# Patient Record
Sex: Female | Born: 1950
Health system: Southern US, Community
[De-identification: ages and names within clinical notes are randomized; demographics above are authoritative.]

## PROBLEM LIST (undated history)

## (undated) DIAGNOSIS — E7800 Pure hypercholesterolemia, unspecified: Secondary | ICD-10-CM

## (undated) DIAGNOSIS — D860 Sarcoidosis of lung: Secondary | ICD-10-CM

## (undated) DIAGNOSIS — E785 Hyperlipidemia, unspecified: Secondary | ICD-10-CM

## (undated) DIAGNOSIS — E119 Type 2 diabetes mellitus without complications: Secondary | ICD-10-CM

## (undated) HISTORY — DX: Type 2 diabetes mellitus without complications: E11.9

## (undated) HISTORY — DX: Sarcoidosis of lung: D86.0

## (undated) HISTORY — DX: Hyperlipidemia, unspecified: E78.5

---

## 1997-10-13 HISTORY — PX: LAPAROSCOPIC CHOLECYSTECTOMY: SUR755

## 2004-10-13 DIAGNOSIS — E119 Type 2 diabetes mellitus without complications: Secondary | ICD-10-CM

## 2004-10-13 HISTORY — DX: Type 2 diabetes mellitus without complications: E11.9

## 2007-11-29 ENCOUNTER — Ambulatory Visit: Payer: Self-pay | Admitting: Infectious Diseases

## 2007-11-29 ENCOUNTER — Ambulatory Visit: Payer: Self-pay | Admitting: Cardiology

## 2007-11-29 ENCOUNTER — Inpatient Hospital Stay (HOSPITAL_COMMUNITY): Admission: EM | Admit: 2007-11-29 | Discharge: 2007-12-04 | Payer: Self-pay | Admitting: Emergency Medicine

## 2007-11-29 ENCOUNTER — Ambulatory Visit: Payer: Self-pay | Admitting: Emergency Medicine

## 2007-12-02 ENCOUNTER — Encounter: Payer: Self-pay | Admitting: Emergency Medicine

## 2007-12-03 ENCOUNTER — Encounter: Payer: Self-pay | Admitting: Infectious Diseases

## 2007-12-10 ENCOUNTER — Ambulatory Visit: Payer: Self-pay | Admitting: Infectious Diseases

## 2007-12-10 ENCOUNTER — Ambulatory Visit (HOSPITAL_COMMUNITY): Admission: RE | Admit: 2007-12-10 | Discharge: 2007-12-10 | Payer: Self-pay | Admitting: Infectious Diseases

## 2007-12-10 DIAGNOSIS — B370 Candidal stomatitis: Secondary | ICD-10-CM

## 2007-12-10 DIAGNOSIS — J189 Pneumonia, unspecified organism: Secondary | ICD-10-CM | POA: Insufficient documentation

## 2007-12-10 LAB — CONVERTED CEMR LAB
ALT: 18 units/L (ref 0–35)
AST: 19 units/L (ref 0–37)
Alkaline Phosphatase: 38 units/L — ABNORMAL LOW (ref 39–117)
Basophils Absolute: 0.1 10*3/uL (ref 0.0–0.1)
Basophils Relative: 1 % (ref 0–1)
Bilirubin, Direct: <0.1 mg/dL (ref 0.0–0.3)
Eosinophils Absolute: 0.1 10*3/uL (ref 0.0–0.7)
Glucose, Bld: 179 mg/dL — ABNORMAL HIGH (ref 70–99)
Hemoglobin: 13.2 g/dL (ref 12.0–15.0)
Lymphocytes Relative: 12 % (ref 12–46)
Lymphs Abs: 0.8 10*3/uL (ref 0.7–4.0)
Neutrophils Relative %: 77 % (ref 43–77)
Platelets: 337 10*3/uL (ref 150–400)
RBC: 5.16 M/uL — ABNORMAL HIGH (ref 3.87–5.11)
Sodium: 137 meq/L (ref 135–145)
Total Protein: 7.2 g/dL (ref 6.0–8.3)

## 2007-12-14 DIAGNOSIS — E113299 Type 2 diabetes mellitus with mild nonproliferative diabetic retinopathy without macular edema, unspecified eye: Secondary | ICD-10-CM

## 2007-12-27 ENCOUNTER — Ambulatory Visit (HOSPITAL_COMMUNITY): Admission: RE | Admit: 2007-12-27 | Discharge: 2007-12-27 | Payer: Self-pay | Admitting: Infectious Diseases

## 2007-12-27 ENCOUNTER — Ambulatory Visit: Payer: Self-pay | Admitting: Infectious Diseases

## 2008-02-29 ENCOUNTER — Ambulatory Visit: Payer: Self-pay | Admitting: Infectious Diseases

## 2008-02-29 ENCOUNTER — Ambulatory Visit (HOSPITAL_COMMUNITY): Admission: RE | Admit: 2008-02-29 | Discharge: 2008-02-29 | Payer: Self-pay | Admitting: Infectious Diseases

## 2008-02-29 LAB — CONVERTED CEMR LAB
BUN: 17 mg/dL (ref 6–23)
Chloride: 99 meq/L (ref 96–112)
Potassium: 5 meq/L (ref 3.5–5.3)

## 2008-03-08 ENCOUNTER — Telehealth: Payer: Self-pay | Admitting: Infectious Diseases

## 2008-03-08 ENCOUNTER — Ambulatory Visit: Payer: Self-pay | Admitting: Infectious Diseases

## 2008-03-09 ENCOUNTER — Ambulatory Visit (HOSPITAL_COMMUNITY): Admission: RE | Admit: 2008-03-09 | Discharge: 2008-03-09 | Payer: Self-pay | Admitting: Infectious Diseases

## 2008-03-16 ENCOUNTER — Encounter (INDEPENDENT_AMBULATORY_CARE_PROVIDER_SITE_OTHER): Payer: Self-pay | Admitting: Licensed Clinical Social Worker

## 2008-04-21 ENCOUNTER — Ambulatory Visit: Payer: Self-pay | Admitting: Emergency Medicine

## 2008-04-21 DIAGNOSIS — R930 Abnormal findings on diagnostic imaging of skull and head, not elsewhere classified: Secondary | ICD-10-CM

## 2008-04-27 DIAGNOSIS — D860 Sarcoidosis of lung: Secondary | ICD-10-CM

## 2008-04-27 HISTORY — DX: Sarcoidosis of lung: D86.0

## 2008-04-28 ENCOUNTER — Ambulatory Visit: Admission: RE | Admit: 2008-04-28 | Discharge: 2008-04-28 | Payer: Self-pay | Admitting: Emergency Medicine

## 2008-04-28 ENCOUNTER — Ambulatory Visit: Payer: Self-pay | Admitting: Emergency Medicine

## 2008-04-28 ENCOUNTER — Encounter: Payer: Self-pay | Admitting: Emergency Medicine

## 2008-05-02 LAB — CONVERTED CEMR LAB: Angiotensin 1 Converting Enzyme: 73 units/L — ABNORMAL HIGH (ref 9–67)

## 2008-05-10 ENCOUNTER — Ambulatory Visit: Payer: Self-pay | Admitting: Emergency Medicine

## 2008-05-23 ENCOUNTER — Telehealth: Payer: Self-pay | Admitting: Emergency Medicine

## 2008-06-20 ENCOUNTER — Ambulatory Visit: Payer: Self-pay | Admitting: Emergency Medicine

## 2008-06-20 DIAGNOSIS — D869 Sarcoidosis, unspecified: Secondary | ICD-10-CM

## 2008-06-22 ENCOUNTER — Ambulatory Visit: Payer: Self-pay | Admitting: Internal Medicine

## 2008-06-22 ENCOUNTER — Encounter: Payer: Self-pay | Admitting: Internal Medicine

## 2008-06-22 LAB — CONVERTED CEMR LAB
AST: 21 units/L (ref 0–37)
Albumin: 4.7 g/dL (ref 3.5–5.2)
Alkaline Phosphatase: 49 units/L (ref 39–117)
BUN: 16 mg/dL (ref 6–23)
Blood Glucose, Fingerstick: 144
Creatinine, Ser: 0.61 mg/dL (ref 0.40–1.20)
Creatinine, Urine: 123.9 mg/dL
Hgb A1c MFr Bld: 6.1 %
Microalb, Ur: 0.7 mg/dL (ref 0.00–1.89)
Total Bilirubin: 0.8 mg/dL (ref 0.3–1.2)
Total CHOL/HDL Ratio: 5.3
VLDL: 34 mg/dL (ref 0–40)

## 2008-06-23 DIAGNOSIS — E785 Hyperlipidemia, unspecified: Secondary | ICD-10-CM | POA: Insufficient documentation

## 2008-07-20 ENCOUNTER — Ambulatory Visit: Payer: Self-pay | Admitting: Infectious Disease

## 2008-07-20 LAB — CONVERTED CEMR LAB: Blood Glucose, Fingerstick: 132

## 2008-09-01 ENCOUNTER — Ambulatory Visit: Payer: Self-pay | Admitting: Cardiology

## 2008-09-11 ENCOUNTER — Telehealth: Payer: Self-pay | Admitting: Infectious Diseases

## 2008-09-11 ENCOUNTER — Ambulatory Visit: Payer: Self-pay | Admitting: Emergency Medicine

## 2008-11-07 ENCOUNTER — Encounter: Payer: Self-pay | Admitting: Internal Medicine

## 2008-11-07 ENCOUNTER — Ambulatory Visit: Payer: Self-pay | Admitting: Infectious Disease

## 2008-11-07 LAB — CONVERTED CEMR LAB: Blood Glucose, Fingerstick: 134

## 2008-11-14 ENCOUNTER — Telehealth: Payer: Self-pay | Admitting: Internal Medicine

## 2009-01-31 ENCOUNTER — Telehealth: Payer: Self-pay | Admitting: *Deleted

## 2009-02-02 ENCOUNTER — Telehealth: Payer: Self-pay | Admitting: *Deleted

## 2009-05-07 ENCOUNTER — Telehealth: Payer: Self-pay | Admitting: Internal Medicine

## 2009-05-29 ENCOUNTER — Ambulatory Visit: Payer: Self-pay | Admitting: Internal Medicine

## 2009-05-29 LAB — CONVERTED CEMR LAB: Hgb A1c MFr Bld: 6.8 %

## 2009-05-30 LAB — CONVERTED CEMR LAB
AST: 24 units/L (ref 0–37)
BUN: 11 mg/dL (ref 6–23)
Cholesterol: 212 mg/dL — ABNORMAL HIGH (ref 0–200)
Creatinine, Ser: 0.69 mg/dL (ref 0.40–1.20)
Glucose, Bld: 141 mg/dL — ABNORMAL HIGH (ref 70–99)
LDL Cholesterol: 142 mg/dL — ABNORMAL HIGH (ref 0–99)
Microalb Creat Ratio: 6.4 mg/g (ref 0.0–30.0)
Potassium: 4.6 meq/L (ref 3.5–5.3)
Total CHOL/HDL Ratio: 5.6
Total Protein: 7.7 g/dL (ref 6.0–8.3)

## 2009-06-05 ENCOUNTER — Ambulatory Visit (HOSPITAL_COMMUNITY): Admission: RE | Admit: 2009-06-05 | Discharge: 2009-06-05 | Payer: Self-pay | Admitting: Internal Medicine

## 2009-06-05 ENCOUNTER — Encounter: Payer: Self-pay | Admitting: Gastroenterology

## 2009-06-05 DIAGNOSIS — J984 Other disorders of lung: Secondary | ICD-10-CM

## 2009-06-12 ENCOUNTER — Encounter: Payer: Self-pay | Admitting: Internal Medicine

## 2009-06-21 ENCOUNTER — Telehealth: Payer: Self-pay | Admitting: Internal Medicine

## 2009-06-26 ENCOUNTER — Ambulatory Visit (HOSPITAL_COMMUNITY): Admission: RE | Admit: 2009-06-26 | Discharge: 2009-06-26 | Payer: Self-pay | Admitting: Internal Medicine

## 2009-08-21 ENCOUNTER — Telehealth: Payer: Self-pay | Admitting: *Deleted

## 2009-08-27 ENCOUNTER — Telehealth: Payer: Self-pay | Admitting: *Deleted

## 2009-09-12 ENCOUNTER — Encounter: Payer: Self-pay | Admitting: Internal Medicine

## 2009-09-12 LAB — HM DIABETES EYE EXAM

## 2009-11-26 ENCOUNTER — Ambulatory Visit: Payer: Self-pay | Admitting: Internal Medicine

## 2009-11-26 LAB — CONVERTED CEMR LAB
Blood Glucose, AC Bkfst: 108 mg/dL
Hgb A1c MFr Bld: 7.2 %

## 2009-12-06 ENCOUNTER — Telehealth: Payer: Self-pay | Admitting: Internal Medicine

## 2009-12-20 ENCOUNTER — Ambulatory Visit (HOSPITAL_COMMUNITY): Admission: RE | Admit: 2009-12-20 | Discharge: 2009-12-20 | Payer: Self-pay | Admitting: Internal Medicine

## 2010-03-29 IMAGING — PT NM PET TUM IMG INITIAL (PI) SKULL BASE T - THIGH
1 of 8 series · 1 of 25 positions shown · non-contrast
Comparison: 06/05/2009 chest CT

CLINICAL DATA: Recent CT imaging demonstrates a new spiculated
nodule in the right upper lobe.  PET CT requested to evaluate for
possible malignancy. Patient with sarcoid and lung involvement and
history of smoking.

NUCLEAR MEDICINE PET CT SKULL BASE TO THIGH
TECHNIQUE: 15.6 mCi F-18 FDG was injected intravenously via the
left antecubital vein.  Full-ring PET imaging was performed from
the skull base through the mid-thighs 54  minutes after injection.
CT data was obtained and used for attenuation correction and
anatomic localization only.  (This was not acquired as a diagnostic
CT examination.)
Fasting Blood Glucose:  141

[Series 2: ct images · axial · 3.8mm · 0.98mm/px · 1 of 255 slices shown]
[im 255/255  brain]
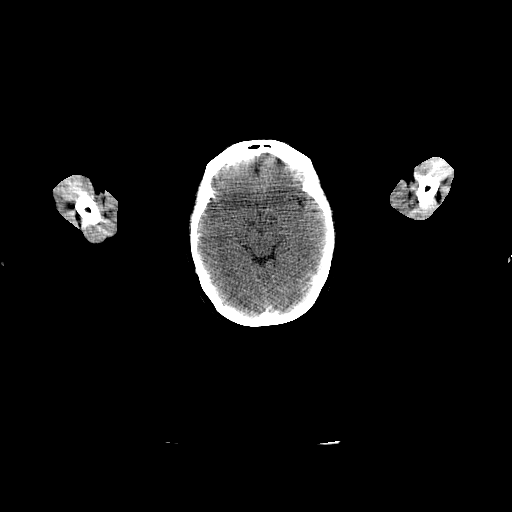

[1 of 25 positions shown; findings below may reference images not displayed]

FINDINGS: No abnormal areas of increased metabolic activity are
identified.

The 1.6 cm right upper lobe spiculated opacity (image 68) has a
maximum S U V of 1.9.

Review of the CT images demonstrates mild cardiomegaly, mildly
enlarged non-hypermetabolic bilateral hilar and mediastinal lymph
nodes, chronic interstitial opacities and bibasilar scarring, fatty
infiltration of the liver, cholecystectomy, and mild colonic
diverticulosis.
No acute or suspicious bony abnormalities are identified.
IMPRESSION: 1.6 cm right upper lobe spiculated opacity does not demonstrate
increased F D G activity and is likely benign, probably related to
this patient's sarcoid.  However as low grade or non-hypermetabolic
neoplasm is not entirely excluded, recommend CT follow-up in 6-12
months to insure stability / resolution.

Stable pulmonary changes, mild hilar mediastinal lymphadenopathy,
fatty infiltration of the liver, and mild cardiomegaly when
compared to the prior study.

## 2010-07-30 ENCOUNTER — Telehealth: Payer: Self-pay | Admitting: Internal Medicine

## 2010-08-28 ENCOUNTER — Telehealth (INDEPENDENT_AMBULATORY_CARE_PROVIDER_SITE_OTHER): Payer: Self-pay | Admitting: *Deleted

## 2010-10-01 ENCOUNTER — Telehealth: Payer: Self-pay | Admitting: *Deleted

## 2010-11-03 ENCOUNTER — Encounter: Payer: Self-pay | Admitting: Internal Medicine

## 2010-11-04 ENCOUNTER — Encounter: Payer: Self-pay | Admitting: Internal Medicine

## 2010-11-12 NOTE — Progress Notes (Signed)
Summary: PREVENTIVE COLONOSCOPY  Phone Note Outgoing Call   Summary of Call: Checked EMR and [facility].  No information found in regards to patient having a Colonoscopy.  Patient was sch on 06/05/2009 for a Nurse visit with Dr. Marvell Fuller office and patient was a no show.  Patient's procedure was sch for Sept 9, 2011.  A no show letter was sent to patient.  Checked patient's insurance.  She was given a [facility] Card through [employer] which expired in 04/11 of this year.  Patient will have to update her information with [facility]. Initial call taken by: Shon Hough,  August 28, 2010 1:33 PM

## 2010-11-12 NOTE — Assessment & Plan Note (Signed)
Summary: EST-CK/FU/MEDS/CFB   Vital Signs:  Patient profile:   60 year old female Height:      61 inches Weight:      187.6 pounds BMI:     35.57 Temp:     97.4 degrees F oral Pulse rate:   80 / minute BP sitting:   108 / 74  (right arm)  Vitals Entered By: Filomena Jungling NT II (November 26, 2009 4:23 PM) CC: checkup Is Patient Diabetic? Yes Pain Assessment Patient in pain? no      Nutritional Status BMI of > 30 = obese  Does patient need assistance? Functional Status Self care Ambulation Normal   Primary Care Provider:  Clydie Braun MD  CC:  checkup.  History of Present Illness: [name] is a 60 yo woman with PMH as outlined below.  She is here for routine follow up and without complaints.  She last had a PET-CT scan for new lung nodule noted on CT for sarcoidosis surveillence.  PET-CT reassuring in 06/2009.  She continues to do well without any new respiratory complaints.  She has stopped exercising but not due to symptoms, mainly b/c she was taking care of her grandaughter and cold weather.  She has been out of her lovastatin for some time now and reports she has just run out of her metformin.   All other systems reviewed and negative.  Preventive Screening-Counseling & Management  Alcohol-Tobacco     Smoking Status: quit     Year Quit: 10 years ago  Caffeine-Diet-Exercise     Does Patient Exercise: no  Medications Prior to Update: 1)  Metformin Hcl 1000 Mg  Tabs (Metformin Hcl) .... Take 1 Tablet By Mouth Two Times A Day 2)  Lovastatin 10 Mg Tabs (Lovastatin) .... 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  Lives with husband and son in an appartment.  has lived there for 3 yrs, in good repair, no water damage.  No sick contacts.  Does occas have contact with grandchildren ages 64 and 92.  No currnet pet or animal exposure, but she owned a bird (?canary) while in [place] - not for over 5 yrs.  No recent travel.  Immigrated from [place] 5 yrs ago.  No tob, etoh drugs. No known exposure to TB .   Risk Factors: Exercise: no (11/26/2009)  Risk Factors: Smoking Status: quit (11/26/2009)  Past Medical History: Diabetes mellitus, type II:  Dx'd about 2006. Sarcoidosis:  brushing during FOB by Dr. Delton Coombes on 04/27/08:  non-caseating granulomas, giant cells with                       refractile material      CT 09/01/08:           -Bilateral patchy airspace disease.  Mild traction bronchiectasis lower lobes.  Bihilar adenopathy.                            Mediastinal adenopathy, approximately the same in degree as noted on 03/09/2008 CT.        CT 06/05/09:           -Stable mild mediastinal and hilar adenopathy and chronic nodular  interstitial lung disease, consistent with              sarcoidosis.        -New 16 mm spiculated nodule in the right upper lobe.  While this may be related to sarcoidosis,                              bronchogenic carcinoma cannot be excluded.  PET CT is unlikely to be beneficial in this setting, and                       percutaneous needle biopsy or short-term CT followup in 2-3 months should be considered.      PET 06/26/09:            -1.6 cm right upper lobe spiculated opacity does not demonstrate increased F D G activity and is likely                     benign, probably related to this patient's sarcoid.  However as low grade or non-hypermetabolic           neoplasm is not entirely excluded, recommend CT follow-up in 6-12 months to insure stability / resolution.           -Stable pulmonary changes, mild hilar mediastinal lymphadenopathy, fatty infiltration of the liver, and mild                cardiomegaly when compared to the  prior study.           Prior consideration:         -Bilateral PNA of unclear etiology Feb 2009 -seen by ID and pulm wihtout obvious source - ? ILD               -extensive infectious w/u including BAL and prelim autoimmune w/u neg, including ACE level, see                           comments on bilateral PNA (problem list).   Pneumococcal vaccine 2007.  Review of Systems      See HPI  Physical Exam  General:  alert, normal appearance, and overweight-appearing.   Eyes:  anicteric Neck:  no masses, thyromegaly, or abnormal cervical nodes Lungs:  normal respiratory effort, no accessory muscle use, normal breath sounds, no crackles, and no wheezes.   Heart:  normal rate, regular rhythm, no murmur, no gallop, no rub, and no JVD.   Abdomen:  soft, non-tender, normal bowel sounds, and no distention.   Extremities:  no edema Neurologic:  alert & oriented X3, cranial nerves II-XII intact, strength normal in all extremities, sensation intact to light touch, and gait normal.   Cervical Nodes:  no anterior cervical adenopathy and no posterior cervical adenopathy.   Psych:  Oriented X3, memory intact for recent and remote, normally interactive, good eye contact, not anxious appearing, and not depressed appearing.     Impression & Recommendations:  Problem # 1:  LUNG NODULE (ICD-518.89) Has 6 month f/u CT 12/20/09  Problem # 2:  SARCOIDOSIS (ICD-135) Symptomatically doing well. Has 6 month f/u CT on 12/20/09 as above Will schedule with Dr. Delton Coombes after CT scan  Problem # 3:  DIABETES MELLITUS, TYPE II (ICD-250.00) A1c slightly worse Discussed diet and exercise She has been drinking some regular sodas, juices and sweets.... discussed issues with excess sugar  and supplement with complex carbs If not improved in next 3 months, will start glipizide  Her updated medication list for this problem includes:    Metformin Hcl 1000 Mg Tabs (Metformin hcl) .Marland Kitchen... Take 1 tablet by mouth two times a  day  Orders: T- Capillary Blood Glucose (16109) T-Hgb A1C (in-house) (60454UJ)  Labs Reviewed: Creat: 0.69 (05/29/2009)     Last Eye Exam: No diabetic retinopathy.    (09/12/2009) Reviewed HgBA1c results: 7.2 (11/26/2009)  6.8 (05/29/2009)  Problem # 4:  HYPERLIPIDEMIA (ICD-272.4) Has not taken lovastatin in last few months. Initially took pravachol but had some myalgias Will give trial of simvastatin ($5 at [facility]) as $4 lovastatin unlikely to reduce LDL sufficiently  Her updated medication list for this problem includes:    Simvastatin 20 Mg Tabs (Simvastatin) .Marland Kitchen... Take 1 tablet by mouth once a day  Labs Reviewed: SGOT: 24 (05/29/2009)   SGPT: 35 (05/29/2009)   HDL:38 (05/29/2009), 48 (06/22/2008)  LDL:142 (05/29/2009), 172 (06/22/2008)  Chol:212 (05/29/2009), 254 (06/22/2008)  Trig:158 (05/29/2009), 168 (06/22/2008)  Problem # 5:  Preventive Health Care (ICD-V70.0) flu tetanus f/u mammo and gi referrals   Orders: T- Capillary Blood Glucose (81191) T-Hgb A1C (in-house) (47829FA)  Complete Medication List: 1)  Metformin Hcl 1000 Mg Tabs (Metformin hcl) .... Take 1 tablet by mouth two times a day 2)  Simvastatin 20 Mg Tabs (Simvastatin) .... Take 1 tablet by mouth once a day  Patient Instructions: 1)  Please schedule a follow-up appointment in 1 month (after 12/20/09) 2)  Cambiamos el medicamento para el colesterol.  Lo puedes conseguir en la farmacia del departamento de salud ([facility]   3)  Aquerdese de eliminar los carboidratos. 4)  Le vamos a sacar una cita con Dr. Delton Coombes para que lo veas despues de el CT scan. Prescriptions: METFORMIN HCL 1000 MG  TABS (METFORMIN HCL) Take 1 tablet by mouth two times a day  #60 x 6   Entered and Authorized by:   Mariea Stable MD   Signed by:   Mariea Stable MD on 11/26/2009   Method used:   Print then Give to Patient   RxID:   [id number] SIMVASTATIN 20 MG TABS (SIMVASTATIN) Take 1 tablet by  mouth once a day  #30 x 6   Entered and Authorized by:   Mariea Stable MD   Signed by:   Mariea Stable MD on 11/26/2009   Method used:   Print then Give to Patient   RxID:   [id number]     Laboratory Results   Blood Tests   Date/Time Received: November 26, 2009 4:28 PM Date/Time Reported: Alric Quan  November 26, 2009 4:28 PM  HGBA1C: 7.2%   (Normal Range: Non-Diabetic - 3-6%   Control Diabetic - 6-8%) CBG Fasting:: 108mg /dL      Prevention & Chronic Care Immunizations   Influenza vaccine: Fluvax 3+  (07/20/2008)    Tetanus booster: Not documented    Pneumococcal vaccine: Historical  (10/13/2005)  Colorectal Screening   Hemoccult: Not documented    Colonoscopy: Not documented   Colonoscopy action/deferral: GI referral  (05/29/2009)  Other Screening   Pap smear: Not documented   Pap smear action/deferral: Deferred  (11/26/2009)    Mammogram: Not documented   Mammogram action/deferral: Ordered  (05/29/2009)   Smoking status: quit  (11/26/2009)  Diabetes Mellitus   HgbA1C: 7.2  (11/26/2009)    Eye exam: No diabetic retinopathy.     (09/12/2009)    Foot exam: yes  (  11/07/2008)   High risk foot: No  (11/07/2008)   Foot care education: Not documented    Urine microalbumin/creatinine ratio: 6.4  (05/29/2009)    Diabetes flowsheet reviewed?: Yes   Progress toward A1C goal: Unchanged  Lipids   Total Cholesterol: 212  (05/29/2009)   LDL: 142  (05/29/2009)   LDL Direct: Not documented   HDL: 38  (05/29/2009)   Triglycerides: 158  (05/29/2009)    SGOT (AST): 24  (05/29/2009)   SGPT (ALT): 35  (05/29/2009)   Alkaline phosphatase: 59  (05/29/2009)   Total bilirubin: 0.6  (05/29/2009)    Lipid flowsheet reviewed?: Yes   Progress toward LDL goal: Unchanged  Self-Management Support :    Diabetes self-management support: Written self-care plan  (11/26/2009)   Diabetes care plan printed    Lipid self-management support: Written self-care  plan  (11/26/2009)   Lipid self-care plan printed.   Nursing Instructions: Give Flu vaccine today Give tetanus booster today

## 2010-11-12 NOTE — Progress Notes (Signed)
  Phone Note Outgoing Call   Call placed by: Mariea Stable MD,  December 06, 2009 11:56 AM Call placed to: Patient Summary of Call: pt called and notified of CT and Dr. Kavin Leech appointments. Initial call taken by: Mariea Stable MD,  December 06, 2009 11:56 AM

## 2010-11-12 NOTE — Progress Notes (Signed)
Summary: med refill/gp  Phone Note Refill Request Message from:  Patient's son on July 30, 2010 11:58 AM  Refills Requested: Medication #1:  METFORMIN HCL 1000 MG  TABS Take 1 tablet by mouth two times a day Last  appt.  11/26/09; next appt. 09/18/10.   Method Requested: Electronic Initial call taken by: Chinita Pester RN,  July 30, 2010 11:58 AM  Follow-up for Phone Call        Rx faxed to pharmacy Follow-up by: Mariea Stable MD,  July 30, 2010 12:13 PM    Prescriptions: METFORMIN HCL 1000 MG  TABS (METFORMIN HCL) Take 1 tablet by mouth two times a day  #60 x 6   Entered and Authorized by:   Mariea Stable MD   Signed by:   Mariea Stable MD on 07/30/2010   Method used:   Electronically to        [facility] [address] (retail)       [phone]       [place], [place]  [address]       Ph: [phone]       Fax: [phone]   RxID:   [id number]

## 2010-11-13 ENCOUNTER — Ambulatory Visit (INDEPENDENT_AMBULATORY_CARE_PROVIDER_SITE_OTHER): Payer: Self-pay | Admitting: Internal Medicine

## 2010-11-13 ENCOUNTER — Other Ambulatory Visit: Payer: Self-pay | Admitting: Internal Medicine

## 2010-11-13 ENCOUNTER — Encounter: Payer: Self-pay | Admitting: Internal Medicine

## 2010-11-13 VITALS — BP 132/83 | HR 86 | Temp 97.3°F | Ht 61.0 in | Wt 185.6 lb

## 2010-11-13 DIAGNOSIS — Z1211 Encounter for screening for malignant neoplasm of colon: Secondary | ICD-10-CM

## 2010-11-13 DIAGNOSIS — D869 Sarcoidosis, unspecified: Secondary | ICD-10-CM

## 2010-11-13 DIAGNOSIS — Z1231 Encounter for screening mammogram for malignant neoplasm of breast: Secondary | ICD-10-CM

## 2010-11-13 DIAGNOSIS — Z1239 Encounter for other screening for malignant neoplasm of breast: Secondary | ICD-10-CM

## 2010-11-13 DIAGNOSIS — E785 Hyperlipidemia, unspecified: Secondary | ICD-10-CM

## 2010-11-13 DIAGNOSIS — E119 Type 2 diabetes mellitus without complications: Secondary | ICD-10-CM

## 2010-11-13 LAB — GLUCOSE, CAPILLARY: Glucose-Capillary: 175 mg/dL — ABNORMAL HIGH (ref 70–99)

## 2010-11-13 LAB — POCT GLYCOSYLATED HEMOGLOBIN (HGB A1C): Hemoglobin A1C: 7.9

## 2010-11-13 MED ORDER — PRAVASTATIN SODIUM 40 MG PO TABS: 40.0000 mg | ORAL_TABLET | Freq: Every evening | ORAL | Status: DC

## 2010-11-13 MED ORDER — GLIPIZIDE 5 MG PO TABS: 5.0000 mg | ORAL_TABLET | Freq: Every day | ORAL | Status: DC

## 2010-11-13 NOTE — Assessment & Plan Note (Signed)
Stable per imaging.  No symptoms to warrant treatment.

## 2010-11-13 NOTE — Assessment & Plan Note (Signed)
A1c up to 7.9 from 7.2. Start glipizide with largest meal. Recheck in 3 months.

## 2010-11-13 NOTE — Progress Notes (Signed)
  Subjective:    Patient ID: [name], female    DOB: [birth year], 60 y.o.   MRN: [id number]  HPI Here for routine follow up.  She has no complaints today.  Reports a recent viral URI with associated cough now resolved.  Of note, she was referred to GI last visit for colonoscopy but was afraid and did not go to appointment.     Review of Systems  Constitutional: Negative for fever, activity change, appetite change and fatigue.  Respiratory: Negative for cough, chest tightness, shortness of breath and wheezing.   Cardiovascular: Negative for chest pain and palpitations.  Genitourinary: Negative for vaginal bleeding, vaginal discharge and pelvic pain.  Musculoskeletal: Positive for back pain.  Neurological: Negative for weakness and numbness.       Objective:   Physical Exam  Constitutional: She is oriented to person, place, and time. She appears well-developed and well-nourished. No distress.  HENT:  Head: Normocephalic and atraumatic.  Eyes: EOM are normal. Pupils are equal, round, and reactive to light. No scleral icterus.  Neck: Normal range of motion. Neck supple.  Pulmonary/Chest: Effort normal.  Neurological: She is alert and oriented to person, place, and time. Coordination normal.  Skin: She is not diaphoretic.  Psychiatric: She has a normal mood and affect. Her behavior is normal. Thought content normal.          Assessment & Plan:

## 2010-11-13 NOTE — Assessment & Plan Note (Signed)
Not taking simvastatin because [facility] pharmacy is too far. Will start pravastatin since it is on $4 list at [facility]. Check lipids and LFTs in 3 months, will defer on checking now as assume they will be elevated as they were last time.

## 2010-11-13 NOTE — Assessment & Plan Note (Signed)
A1c up to 7.9 from 7.2 Will start glipizide 5mg  po daily and f/u 3 months.

## 2010-11-14 NOTE — Progress Notes (Signed)
Summary: med refill/gp  Phone Note Refill Request Message from:  Fax from Pharmacy on October 01, 2010 2:22 PM  Refills Requested: Medication #1:  SIMVASTATIN 20 MG TABS Take 1 tablet by mouth once a day. Last appt. Feb 2011.   Method Requested: Electronic Initial call taken by: Chinita Pester RN,  October 01, 2010 2:22 PM  Follow-up for Phone Call        Refilled electronically. Please make an appointment and notify patient. Follow-up by: Margarito Liner MD,  October 03, 2010 9:14 AM  Additional Follow-up for Phone Call Additional follow up Details #1::        Flag sent to [facility] for an appt. Waiting on Dr.Alvarez's Feb.schedule. Additional Follow-up by: Chinita Pester RN,  October 03, 2010 3:31 PM    Prescriptions: SIMVASTATIN 20 MG TABS (SIMVASTATIN) Take 1 tablet by mouth once a day  #30 x 0   Entered and Authorized by:   Margarito Liner MD   Signed by:   Margarito Liner MD on 10/03/2010   Method used:   Electronically to        [facility] [address] (retail)       [phone]       [place], [place]  [address]       Ph: [phone]       Fax: [phone]   RxID:   [id number]

## 2010-11-26 ENCOUNTER — Ambulatory Visit (HOSPITAL_COMMUNITY): Payer: Self-pay

## 2010-11-27 ENCOUNTER — Ambulatory Visit (HOSPITAL_COMMUNITY)
Admission: RE | Admit: 2010-11-27 | Discharge: 2010-11-27 | Disposition: A | Discharge status: Home / Self Care | Payer: Self-pay | Source: Ambulatory Visit | Attending: Internal Medicine | Admitting: Internal Medicine

## 2010-11-27 DIAGNOSIS — Z1231 Encounter for screening mammogram for malignant neoplasm of breast: Secondary | ICD-10-CM | POA: Insufficient documentation

## 2010-12-03 ENCOUNTER — Telehealth: Payer: Self-pay | Admitting: *Deleted

## 2010-12-03 NOTE — Telephone Encounter (Signed)
Call to pt to inform her of her appointment with [facility].  Nurse visit scheduled at no charge.  Appointment is 01/30/2011 at 4:30 PM.  Cost of the Colonoscopy runs from $1500.00 up .  Message left to have pt's son call the Clinics.

## 2011-01-01 LAB — GLUCOSE, CAPILLARY: Glucose-Capillary: 108 mg/dL — ABNORMAL HIGH (ref 70–99)

## 2011-01-17 LAB — GLUCOSE, CAPILLARY: Glucose-Capillary: 141 mg/dL — ABNORMAL HIGH (ref 70–99)

## 2011-01-18 LAB — GLUCOSE, CAPILLARY: Glucose-Capillary: 121 mg/dL — ABNORMAL HIGH (ref 70–99)

## 2011-02-25 ENCOUNTER — Other Ambulatory Visit: Payer: Self-pay | Admitting: *Deleted

## 2011-02-25 MED ORDER — METFORMIN HCL 1000 MG PO TABS: 1000.0000 mg | ORAL_TABLET | Freq: Two times a day (BID) | ORAL | Status: DC

## 2011-02-25 NOTE — Op Note (Signed)
[name], [name]              ACCOUNT NO.:  [id number]   MEDICAL RECORD NO.:  [id number]          PATIENT TYPE:  AMB   LOCATION:  CARD                         FACILITY:  [facility]   PHYSICIAN:  Leslye Peer, MD    DATE OF BIRTH:  06/17/1951   DATE OF PROCEDURE:  04/28/2008  DATE OF DISCHARGE:                               OPERATIVE REPORT   PROCEDURES:  Fiberoptic bronchoscopy with bronchoalveolar lavage and  transbronchial biopsies.   OPERATOR:  Leslye Peer, MD   INDICATION:  Bilateral infiltrates.   CONSENT:  Informed consent was obtained from the patient using a Manufacturing engineer and a signed copy is on her hospital chart.   MEDICATIONS GIVEN:  1. Fentanyl 125 mcg IV in divided doses.  2. Versed 6 mg IV in divided doses.  3. Lidocaine 1% for a total of 12 mL to the bronchoalveolar tree.   PROCEDURE IN DETAIL:  After informed consent was obtained as outlined  above conscious sedation was initiated.  The fiberoptic bronchoscope was  introduced through the right naris without difficulty.  The cords were  normal in appearance and moved normally with phonation and with deep  inspiration.  The trachea was intubated and local anesthesia was  achieved with 1% lidocaine to the bronchoalveolar tree.  The entire exam  showed normal anatomy with a sharp main carina, clear bilateral mainstem  bronchi.  The left side of the exam showed no abnormalities in the left  upper lingular and left lower lobe airways.  In particular there were no  abnormal secretions and no endobronchial lesions were seen.  The right-  sided anatomy was likewise normal.  The right upper lobe, right middle  lobe and right lower lobe bronchi were all clear without any secretions  or endobronchial lesions.  Given the abnormalities noted on her CT scan  of the chest, bronchoalveolar lavage was performed from the right upper  lobe to be sent for microbiology, cytology and also CD4 and CD8 counts.  Transbronchial biopsies were then performed from several subsegments of  the right lower lobe.  Four biopsies total were performed and these were  sent for pathology.  The patient tolerated the procedure well.  The  biopsies were performed under fluoroscopy and there was no evidence of  pneumothorax on fluoroscopy at the end of the procedure.  A chest x-ray  was pending.   PLANS:  We will follow up the pathology and microbiology with Ms.  [name] when it becomes available.      Leslye Peer, MD  Electronically Signed    RSB/MEDQ  D:  04/28/2008  T:  04/28/2008  Job:  [id number]   cc:   Mick Sell, MD

## 2011-02-25 NOTE — Telephone Encounter (Signed)
Pt is out of meds Call Onalee Hua at [phone] when ready

## 2011-02-25 NOTE — Op Note (Signed)
[name], [name]              ACCOUNT NO.:  [id number]   MEDICAL RECORD NO.:  [id number]          PATIENT TYPE:  INP   LOCATION:  5509                         FACILITY:  [facility]   PHYSICIAN:  Leslye Peer, MD    DATE OF BIRTH:  1951-09-12   DATE OF PROCEDURE:  12/02/2007  DATE OF DISCHARGE:                               OPERATIVE REPORT   PROCEDURE:  Fiberoptic bronchoscopy with bronchoalveolar lavage and  brushings.   OPERATOR:  Leslye Peer, M.D.   INDICATION:  Bilateral infiltrates, rule out tuberculosis.   MEDICATIONS GIVEN:  1. Included fentanyl 100 mcg IV in divided doses and Versed 4 mg IV in      divided doses.  2. Lidocaine 1%, 15-20 mL to the bronchoalveolar tree.   CONSENT:  Informed consent was obtained from the patient.  A signed copy  is on her hospital chart.   PROCEDURE DETAILS:  After informed consent was obtained as outlined  above, conscious sedation was initiated as outlined above, the  fiberoptic bronchoscope was introduced in the right naris without  difficulty.  The posterior pharynx was anesthetized with 1% lidocaine.  The cords were normal in appearance and moved normally.  The trachea was  intubated and appeared normal, as well.  Local anesthesia was again  performed with 1% lidocaine for approximately 15-20 mL total to the  bronchoalveolar tree. All airways were inspected including the bilateral  main stem bronchi, the right upper lobe, middle lobe and lower lobe  bronchi, and the left upper lobe, lingula, and lower lobe bronchi.  There were no gross anatomical abnormality, there were no endobronchial  lesions, and there were no significant secretions.  Bronchoalveolar  lavage was performed from two subsegments of the right lower lobe with a  total of 60 mL instilled and 28 mL returned.  These were pooled and will  be sent for microbiology and cytology. Brushings were performed from the  posterior segment of the right lower lobe and were  placed on slide for  AFB stain.  The patient tolerated procedure well.  There was no blood  loss and no obvious complication.  She was hemodynamically stable  throughout. A chest x-ray is pending at the end of the procedure.   SAMPLES:  1. Bronchoalveolar lavage from the right lower lobe to be sent for      microbiology.  2. Bronchial brushings from the right lower lobe to be sent for AFB      stain.   PLAN:  I will follow up the culture results when they become available.      Leslye Peer, MD  Electronically Signed     RSB/MEDQ  D:  12/02/2007  T:  12/03/2007  Job:  [id number]

## 2011-02-25 NOTE — Discharge Summary (Signed)
[name], [name]              ACCOUNT NO.:  [id number]   MEDICAL RECORD NO.:  [id number]          PATIENT TYPE:  INP   LOCATION:  5509                         FACILITY:  [facility]   PHYSICIAN:  Mick Sell, MD DATE OF BIRTH:  1951-08-21   DATE OF ADMISSION:  11/29/2007  DATE OF DISCHARGE:  12/05/2007                               DISCHARGE SUMMARY   PRIMARY CARE PHYSICIAN:  [facility].   CONTINUITY PHYSICIAN:  Mick Sell, M.D., [facility], Infectious Disease Division.   DISCHARGE DIAGNOSES:  1. 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  2. Type 2 diabetes mellitus.  3. Back pain of unclear etiology.  4. History of immigration from [place].   DISCHARGE MEDICATIONS:  1. Metformin 1000 mg by mouth twice daily.  2. Lisinopril 5 mg by mouth daily.  3. Isoniazid 300 mg by mouth daily for 6 months.  4. Rifampin 600 mg by mouth daily for 6 months  5. Ethambutol 1200 mg by mouth daily for 6 months.  6. Pyrazinamide 1500 mg by mouth daily for 6 months.  7. Vitamin B6 (pyridoxine) 50 mg by mouth daily for 6 months.   CONSULTATIONS:  Pulmonary critical care medicine was consulted for a  bronchoalveolar lavage.   PROCEDURES:  1. An x-ray with 2 views of the chest on November 29, 2007      demonstrated extensive bilateral pulmonary infiltrates.  2. A computed tomography scan of the chest on November 30, 2007      demonstrated diffuse bilateral infiltrates with fairly bulky      mediastinal and hilar adenopathy.  Findings were read as being      worrisome for tuberculosis or another atypical pulmonary infectious      process.  3. A repeat x-ray of the chest on November 30, 2007, with 2 views,      demonstrated no change in aeration of the lungs.  4. A repeat portable chest x-ray on December 02, 2007 demonstrated no      pneumothorax with lower lung volumes.  No other  changes were noted.  5. A fiberoptic bronchoscopy with bronchoalveolar lavage and brushings      was performed on December 02, 2007, by Dr. Levy Pupa.  The      patient tolerated this procedure well without complications.  6. A transthoracic echocardiogram was performed on December 03, 2007,      revealing overall normal left ventricular systolic function.  The      left ventricular ejection fraction was 55%.  There were no regional      wall motion abnormalities.   ADMISSION HISTORY:  [name] is a 60 year old Hispanic female with a  history of type 2 diabetes mellitus who presented to the [facility]  Emergency Department on November 29, 2007 with a complaint of 2 months  of progressively worsening chest pain, nonproductive cough, and  intermittent palpitations.  The patient endorsed some degree of back  pain with cough and a pulsating sensation in her back as  well.  She  denied subjective fevers, chills, or myalgias.  However, she did endorse  a decreased appetite and a subjective 20-pound weight loss in December  2008.  The patient feels mildly dyspneic at rest.  She does endorse  intermittent perfuse sweating at night, though she says this only  happens after taking certain medications such as Tylenol.  As previously  mentioned, she endorses some intermittent cough but no sputum  production.  She denies any hot flashes, skin changes, or chronic  sensation of cold.  She has noticed no tremor, headache, or focal  neurologic changes.  She is most concerned about the intermittent  palpitations and some shortness of breath.  Of note, she has no sick  contacts at home but she emigrated from [place] to the [place]  approximately 4 years ago.  She was receiving primary care at the [facility] prior to this.  She notes that she was recently started on  Metformin.   ADMISSION PHYSICAL EXAMINATION:  VITAL SIGNS:  On admission were  temperature 98.9 degrees Fahrenheit, blood pressure  129/72, pulse 123,  respirations rate 24, oxygen saturation 94% on room air.  GENERAL:  Lying in bed in no acute distress.  Appears well.  HEENT:  Pupils equal, round, regular, reactive to light and  accommodation.  Extraocular movements intact.  No icterus.  Oropharynx  clear.  Moist mucous membranes.  NECK:  Supple without lymphadenopathy or thyromegaly.  RESPIRATORY:  Diffuse prominent crackles in all lung fields (bases  greater than apices) bilaterally.  No wheezes or rhonchi.  CARDIOVASCULAR:  Tachycardic without appreciable murmurs, rubs, or  gallops.  GASTROINTESTINAL:  Soft, nontender, nondistended.  Positive bowel  sounds.  EXTREMITIES:  No clubbing, cyanosis, or edema.  GENITOURINARY:  No CVA tenderness.  SKIN:  Warm and well perfused.  NEUROLOGIC:  Strength 5/5 throughout.  Sensation and cranial nerves  grossly intact.  Alert and oriented.  Pleasant.  Spanish-speaking only.  PSYCH:  Normally interactive, cooperative.   ADMISSION LABORATORY:  Laboratory studies on admission revealed sodium  136, potassium 4.1, chloride 101, bicarbonate 23, BUN 9, creatinine 0.6,  glucose 152, calcium 9.6.  LFTs revealed bilirubin 0.7, alkaline  phosphatase 51, AST 24, ALT 20, total protein 7.3, albumin 3.1.  White  blood cell count 6.4 with an ANC of 4.9, hemoglobin 13.7 with an MCV of  78, platelet count 380.  Lipase was normal at 20.  Urinalysis was  grossly negative.  BNP was less than 30 picograms per ml.  TSH was  normal at 0. 475.  An HIV antibody test was nonreactive.   HOSPITAL COURSE:  1. Cough, intermittent palpitations, and night sweats for 2 months.      The patient's symptoms on physical exam were concerning for a      pneumonia versus other process.  Common pathogens for pneumonia      such as streptococcus pneumoniae and staphylococcus aureus were      considered somewhat unlikely given the indolent course of the      disease and the progression of the patient's shortness  of breath      for 2 months prior to admission.  Atypical pathogens such as      mycoplasma and legionella were also considered in the differential      diagnosis.  Of primary concern was tuberculosis given the patient's      history of emigration in the past and the somewhat atypical nature  of her symptoms and presentation.  Relevant laboratory testing revealed a negative HIV antibody.  A urine  legionella antigen was negative for legionella pneumophila, serial group  1 was negative.  An initial sputum culture was collected and was not  representative of lower respiratory secretions.  A AFB smear collected  on February 17th revealed no acid fast bacilli; the culture is still  pending.  A serum cryptococcal antigen was negative.  Given the patient's difficulty in producing sputum, even with induction  by respiratory therapy, pulmonary critical care medicine was consulted  for a bronchoalveolar lavage.  That procedure was performed as mentioned  above.  Respiratory culture from that study has revealed no findings  thus far.  A smear taken from the bronchoalveolar lavage fluid revealed  no acid fast bacilli.  A fungal and AFB culture from the bronchoalveolar  lavage was also negative thus far.  Of note, a Histoplasma antigen was  also negative.  The patient's mycoplasma pneumoniae IgM antibody is  within the normal range.  A Coccidioides antibody is still pending as of  the time of this dictation.  Given the patient's negative workup thus far and the lack of change in  her symptoms with empiric Rocephin and azithromycin, it was decided  today, December 04, 2007, that she should be treated empirically for  pulmonary tuberculosis.  Thus, we will start her on 4-drug therapy today  with isoniazid, rifampin, ethambutol, and pyrazinamide with vitamin B6  for supplementation while on isoniazid and discharge her home.  At this  point, we do not have a better explanation for her symptoms than   tuberculosis and thus we will treat her empirically for this.  She will  follow up with Dr. Sampson Goon next week as an outpatient, and he may  adjust her therapy as needed if new results are found.  1. Shortness of breath.  See problem number one above.  Suspect this      is related to the patient's ongoing pulmonary disease.  She is not      requiring oxygen and thus will be discharged home on room air.  2. Palpitations.  The etiology of this symptom is unclear.  The      patient has been on telemetry during her admission without evidence      of acute irregularities or arrhythmias.  Her TSH was within the      normal range.  If she continues to have these symptoms and/or      associated hot flashes, even after starting empiric therapy for      tuberculosis, I would recommend checking further thyroid studies      such as a free T3 and free T4 as an outpatient.  3. Hypertension.  The patient was continued on her home dose of      Lisinopril during this hospitalization.  4. Type 2 diabetes mellitus.  The patient was placed on sliding scale      insulin during this hospitalization.  Given her normal renal      function, she will be discharged to home on her original dose of      Metformin.   DISPOSITION:  As mentioned above, the patient will be discharged today.  Workup thus far has been negative, though it was felt that empiric  therapy for tuberculosis would be most appropriate.   Of note, possible septic emboli from endocarditis was considered and  this is the reason for the 2D echo study mentioned above.  A  transesophageal echo was also considered but it was felt that it would  add little to the patient's overall care and thus was not ordered.   Given that the patient is Spanish-speaking only, extra effort was made  on the part of Dr. Radonna Ricker and medical student Geoffery Lyons to ensure  that the patient and her family understood their plan of care.  Dr.  Radonna Ricker has discussed the  patient's discharge plan with her today and both  she and her family voice understanding.   DISCHARGE LABORATORY:  Laboratory studies on the day of discharge  revealed sodium 140, potassium 4.5, chloride 107, bicarbonate 27, BUN 4,  creatinine 0.54, glucose 183, calcium 8.9.  White blood cell count 5.1,  hemoglobin 12.4, hematocrit 37 with an MCV of 78.2, RDW 14, platelet  count 347.  Fecal occult blood testing has been negative x1 on the day  of discharge.   Of note, a cold agglutinin titer, blood cultures, AFB culture with  smear,  Coccidioides antibody, and a final fecal occult blood test are  pending as of the time of this discharge.   DISCHARGE VITAL SIGNS:  Vital signs on the day of discharge were  temperature 98.3 degrees Fahrenheit, blood pressure 119/80, pulse 82,  respirations rate 20, oxygen saturation 97% on room air.   DISPOSITION/FOLLOWUP:  The patient will follow up with Dr. Clydie Braun in the [facility] next  week (the week of February 23rd).  We have sent a message electronically  to the  outpatient clinic staff asking them to call the patient with an  appointment as soon as it is scheduled.  We will discharge the patient  today with a 3-day supply of all of her medications if we can arrange  this with the pharmacy and then instruct her to where to go on Monday to  begin her normal 53-month course of tuberculosis therapy.      Madelaine Etienne, MD  Electronically Signed      Mick Sell, MD  Electronically Signed    JH/MEDQ  D:  12/04/2007  T:  12/05/2007  Job:  [id number]

## 2011-04-20 ENCOUNTER — Encounter: Payer: Self-pay | Admitting: Internal Medicine

## 2011-07-04 LAB — CULTURE, BLOOD (ROUTINE X 2): Culture: NO GROWTH

## 2011-07-04 LAB — CBC
HCT: 37
HCT: 37
Hemoglobin: 12.5
Hemoglobin: 12.5
MCHC: 33.1
MCHC: 33.3
MCHC: 34
MCV: 78
MCV: 78.2
MCV: 78.5
Platelets: 323
Platelets: 347
Platelets: 370
RBC: 4.75
RBC: 4.76
RBC: 4.87
RBC: 5.17 — ABNORMAL HIGH
RDW: 14
RDW: 14.1
RDW: 14.2
RDW: 14.2

## 2011-07-04 LAB — URINALYSIS, ROUTINE W REFLEX MICROSCOPIC
Bilirubin Urine: NEGATIVE
Glucose, UA: NEGATIVE
Hgb urine dipstick: NEGATIVE
Hgb urine dipstick: NEGATIVE
Ketones, ur: NEGATIVE
Specific Gravity, Urine: 1.011
Specific Gravity, Urine: 1.019
Urobilinogen, UA: 0.2
Urobilinogen, UA: 1

## 2011-07-04 LAB — FUNGUS CULTURE W SMEAR: Fungal Smear: NONE SEEN

## 2011-07-04 LAB — COMPREHENSIVE METABOLIC PANEL
AST: 24
CO2: 23
Calcium: 9.6
Creatinine, Ser: 0.61
GFR calc Af Amer: >60
GFR calc non Af Amer: >60
Glucose, Bld: 152 — ABNORMAL HIGH

## 2011-07-04 LAB — CULTURE, RESPIRATORY W GRAM STAIN

## 2011-07-04 LAB — HISTOPLASMA ANTIGEN, URINE
Histoplasma Antigen Urine: NEGATIVE
Histoplasma Antigen, urine: <2 {EIA'U}

## 2011-07-04 LAB — BASIC METABOLIC PANEL
BUN: 4 — ABNORMAL LOW
BUN: 4 — ABNORMAL LOW
BUN: 4 — ABNORMAL LOW
CO2: 27
CO2: 27
CO2: 28
Calcium: 8.5
Calcium: 8.7
Calcium: 8.8
Chloride: 105
Creatinine, Ser: 0.53
Creatinine, Ser: 0.54
GFR calc Af Amer: >60
GFR calc Af Amer: >60
GFR calc Af Amer: >60
GFR calc non Af Amer: >60
GFR calc non Af Amer: >60
GFR calc non Af Amer: >60
Glucose, Bld: 168 — ABNORMAL HIGH
Glucose, Bld: 183 — ABNORMAL HIGH
Glucose, Bld: 186 — ABNORMAL HIGH
Potassium: 4.1
Potassium: 4.5
Sodium: 136
Sodium: 137

## 2011-07-04 LAB — MISCELLANEOUS TEST

## 2011-07-04 LAB — AFB CULTURE WITH SMEAR (NOT AT ARMC)

## 2011-07-04 LAB — AFB STAIN

## 2011-07-04 LAB — HIV ANTIBODY (ROUTINE TESTING W REFLEX): HIV: NONREACTIVE

## 2011-07-04 LAB — LIPASE, BLOOD: Lipase: 20

## 2011-07-04 LAB — EXPECTORATED SPUTUM ASSESSMENT W GRAM STAIN, RFLX TO RESP C

## 2011-07-04 LAB — DIFFERENTIAL
Lymphocytes Relative: 12
Lymphs Abs: 0.7
Neutrophils Relative %: 76

## 2011-07-04 LAB — LEGIONELLA ANTIGEN, URINE

## 2011-07-04 LAB — COLD AGGLUTININ TITER: Cold Agglutinin Titer: <1:32 {titer}

## 2011-07-04 LAB — B-NATRIURETIC PEPTIDE (CONVERTED LAB): Pro B Natriuretic peptide (BNP): <30

## 2011-07-11 LAB — AFB CULTURE WITH SMEAR (NOT AT ARMC)

## 2011-07-11 LAB — FUNGUS CULTURE W SMEAR

## 2011-07-16 LAB — GLUCOSE, CAPILLARY: Glucose-Capillary: 144 — ABNORMAL HIGH

## 2012-02-10 ENCOUNTER — Other Ambulatory Visit: Payer: Self-pay | Admitting: *Deleted

## 2012-02-10 MED ORDER — METFORMIN HCL 1000 MG PO TABS: 1000.0000 mg | ORAL_TABLET | Freq: Two times a day (BID) | ORAL | Status: DC

## 2012-02-10 NOTE — Telephone Encounter (Signed)
Has not been seen since 11/2010; but has scheduled an appt 02/20/12 with Dr Terrace Arabia. Requesting refill until her appt.  Thanks

## 2012-02-20 ENCOUNTER — Ambulatory Visit (INDEPENDENT_AMBULATORY_CARE_PROVIDER_SITE_OTHER): Payer: Self-pay | Admitting: Internal Medicine

## 2012-02-20 ENCOUNTER — Encounter: Payer: Self-pay | Admitting: Internal Medicine

## 2012-02-20 VITALS — BP 116/78 | HR 87 | Temp 97.6°F | Wt 190.7 lb

## 2012-02-20 DIAGNOSIS — E785 Hyperlipidemia, unspecified: Secondary | ICD-10-CM

## 2012-02-20 DIAGNOSIS — Z1239 Encounter for other screening for malignant neoplasm of breast: Secondary | ICD-10-CM

## 2012-02-20 DIAGNOSIS — Z79899 Other long term (current) drug therapy: Secondary | ICD-10-CM

## 2012-02-20 DIAGNOSIS — D869 Sarcoidosis, unspecified: Secondary | ICD-10-CM

## 2012-02-20 DIAGNOSIS — Z1211 Encounter for screening for malignant neoplasm of colon: Secondary | ICD-10-CM

## 2012-02-20 DIAGNOSIS — E119 Type 2 diabetes mellitus without complications: Secondary | ICD-10-CM

## 2012-02-20 DIAGNOSIS — Z23 Encounter for immunization: Secondary | ICD-10-CM

## 2012-02-20 DIAGNOSIS — Z0000 Encounter for general adult medical examination without abnormal findings: Secondary | ICD-10-CM | POA: Insufficient documentation

## 2012-02-20 LAB — COMPREHENSIVE METABOLIC PANEL
BUN: 15 mg/dL (ref 6–23)
CO2: 30 mEq/L (ref 19–32)
Calcium: 10.1 mg/dL (ref 8.4–10.5)
Chloride: 100 mEq/L (ref 96–112)
Creat: 0.77 mg/dL (ref 0.50–1.10)

## 2012-02-20 LAB — LIPID PANEL
Cholesterol: 259 mg/dL — ABNORMAL HIGH (ref 0–200)
HDL: 36 mg/dL — ABNORMAL LOW (ref >39)
Triglycerides: 561 mg/dL — ABNORMAL HIGH (ref <150)

## 2012-02-20 LAB — POCT GLYCOSYLATED HEMOGLOBIN (HGB A1C): Hemoglobin A1C: 8.2

## 2012-02-20 MED ORDER — METFORMIN HCL 1000 MG PO TABS: 1000.0000 mg | ORAL_TABLET | Freq: Two times a day (BID) | ORAL | Status: DC

## 2012-02-20 MED ORDER — GLIPIZIDE 5 MG PO TABS: 5.0000 mg | ORAL_TABLET | Freq: Two times a day (BID) | ORAL | Status: DC

## 2012-02-20 NOTE — Assessment & Plan Note (Addendum)
No pulmonary symptoms. Last chest CT  In 2011, which revealed stable mediastinal lymphadenopathy.  -Will check calcium today, as hypercalcemia indication for steroid treatment

## 2012-02-20 NOTE — Assessment & Plan Note (Signed)
Patient is noncompliant with pravastatin. She was taking pravastatin 40 mg. She has had no side effects, she just stopped taking it. We will check lipid panel today and restart medication if needed.

## 2012-02-20 NOTE — Progress Notes (Signed)
Interpreter [facility] DR Milbert Coulter

## 2012-02-20 NOTE — Progress Notes (Signed)
  Subjective:   Patient ID: [name] female   DOB: [birth year] 61 y.o.   MRN: [id number]  HPI: [name] is a very pleasant 61 y.o. woman with past medical history significant for diabetes, hyperlipidemia, and sarcoidosis. She presents today for routine followup and medication refills. She was last seen here in February 2012. In the past she has been prescribed metformin, glipizide and pravastatin. She is only compliant with metformin at this time. She denies symptoms of hyperglycemia such as polydipsia, polyphasia, and polyuria. She denies symptoms of hypoglycemia such as headaches, dizziness, diaphoresis.  She stopped taking glipizide and pravastatin because she thought that she did not need it. She had no side effects. She does not check her blood sugars.  She is not follow a low carbohydrate diet. But she does exercise for 1 hour for 3-4 d/week.    She does some odd jobs for income. Otherwise she gets her money from her son. She has had the orange card in the past. She does not have legal papers.  Interpreter present during exam.    Review of Systems: Constitutional: Denies fever, chills, diaphoresis, appetite change and fatigue.  HEENT: Denies photophobia, eye pain, redness, hearing loss, ear pain, congestion, sore throat, rhinorrhea, sneezing, mouth sores, trouble swallowing, neck pain, neck stiffness and tinnitus.   Respiratory: Denies SOB, DOE, cough, chest tightness,  and wheezing.   Cardiovascular: Denies chest pain, palpitations and leg swelling.  Gastrointestinal: Denies nausea, vomiting, abdominal pain, diarrhea, constipation, blood in stool and abdominal distention.  Genitourinary: Denies dysuria, urgency, frequency, hematuria, flank pain and difficulty urinating.  Musculoskeletal: Denies myalgias,  joint swelling, arthralgias and gait problem.  back pain that is worse with activity, resolves with Tylenol Skin: Denies pallor, rash and wound.  Neurological: Denies  dizziness, seizures, syncope, weakness, light-headedness, numbness and headaches.  Hematological: Denies adenopathy. Easy bruising, personal or family bleeding history  Psychiatric/Behavioral: Denies suicidal ideation, mood changes, confusion, nervousness, sleep disturbance and agitation  Objective:  Physical Exam: Filed Vitals:   02/20/12 1421  BP: 116/78  Pulse: 87  Temp: 97.6 F (36.4 C)  TempSrc: Oral  Weight: 190 lb 11.2 oz (86.501 kg)   Constitutional: Vital signs reviewed.  Patient is an obese woman in no acute distress and cooperative with exam.  Mouth: no erythema or exudates, MMM Eyes: PERRL, EOMI, conjunctivae normal, No scleral icterus.  Neck: No thyromegaly Cardiovascular: RRR, S1 normal, S2 normal, no MRG, pulses symmetric and intact bilaterally Pulmonary/Chest: CTAB, no wheezes, rales, or rhonchi Abdominal: Soft. Non-tender, non-distended, bowel sounds are normal, no masses, organomegaly, or guarding present.  Musculoskeletal: No joint deformities, erythema, or stiffness, ROM full and no nontender Neurological: A&O x3, Strength is normal and symmetric bilaterally, cranial nerve II-XII are grossly intact, no focal motor deficit, sensory intact to light touch bilaterally.  Skin: Warm, dry and intact. No rash, cyanosis, or clubbing.  Psychiatric: Normal mood and affect. speech and behavior is normal. Judgment and thought content normal. Cognition and memory are normal.   Assessment & Plan:   Case and care discussed with Dr. Josem Kaufmann. Patient to return in 3 mts for diabetes followup and Pap smear. Please see problem oriented charting for further details.

## 2012-02-20 NOTE — Assessment & Plan Note (Signed)
Lab Results  Component Value Date   HGBA1C 8.2 02/20/2012   HGBA1C 7.2 11/26/2009   CREATININE 0.69 05/29/2009   MICROALBUR 0.91 05/29/2009   MICRALBCREAT 6.4 05/29/2009   CHOL 212* 05/29/2009   HDL 38* 05/29/2009   TRIG 158* 05/29/2009    Last eye exam and foot exam:    Component Value Date/Time   HMDIABEYEEXA no diabetic retinopathy 09/12/2009   HMDIABFOOTEX done 11/07/2008  Foot exam done today (02/20/12).  Patient without insurance, and therefore we will have to do for eye exam. Urine microalbumin collected today. cmet and lipid panel collected today  Assessment: Diabetes control: not controlled Progress toward goals: deteriorated Barriers to meeting goals: lack of insurance, nonadherence to medications and lack of understanding of disease management  Plan: Diabetes treatment: Continue metformin 1000 mg twice daily, add glipizide 5 mg daily (patient has not been compliant with glipizide) Refer to: none Instruction/counseling given: reminded to bring medications to each visit, discussed the need for weight loss and discussed diet

## 2012-02-20 NOTE — Patient Instructions (Signed)
-  Please start taking glipizide 5mg  daily.  Continue metformin 1000mg  twice daily.  -Please return in 3 months for diabetes follow up and pap smear.    Please be sure to bring all of your medications with you to every visit.  Should you have any new or worsening symptoms, please be sure to call the clinic at [phone].

## 2012-02-20 NOTE — Assessment & Plan Note (Signed)
Patient has no insurance, so I gave her FOBT cards. If she returns these we can defer colon cancer screening with colonoscopy for 1 year. She will return in 3 months for Pap smear Pneumovax and tetanus shot given today Patient filled out mammogram scholarship

## 2012-02-21 LAB — MICROALBUMIN / CREATININE URINE RATIO
Microalb Creat Ratio: 6.7 mg/g (ref 0.0–30.0)
Microalb, Ur: 0.5 mg/dL (ref 0.00–1.89)

## 2012-02-23 NOTE — Progress Notes (Signed)
Addended by: Alanson Puls on: 02/23/2012 01:24 PM   Modules accepted: Orders

## 2012-02-24 LAB — LDL CHOLESTEROL, DIRECT: Direct LDL: 154 mg/dL — ABNORMAL HIGH

## 2012-03-25 ENCOUNTER — Ambulatory Visit (HOSPITAL_COMMUNITY)
Admission: RE | Admit: 2012-03-25 | Discharge: 2012-03-25 | Disposition: A | Discharge status: Home / Self Care | Payer: Self-pay | Source: Ambulatory Visit | Attending: Internal Medicine | Admitting: Internal Medicine

## 2012-03-25 DIAGNOSIS — Z1239 Encounter for other screening for malignant neoplasm of breast: Secondary | ICD-10-CM

## 2012-05-26 ENCOUNTER — Telehealth: Payer: Self-pay | Admitting: Internal Medicine

## 2012-05-26 DIAGNOSIS — E785 Hyperlipidemia, unspecified: Secondary | ICD-10-CM

## 2012-05-26 MED ORDER — PRAVASTATIN SODIUM 40 MG PO TABS: 40.0000 mg | ORAL_TABLET | Freq: Every evening | ORAL | Status: DC

## 2012-05-26 NOTE — Telephone Encounter (Signed)
Called patient regarding elevated LDL.  Will restart pravastatin.  Patient agreeable.

## 2012-08-10 ENCOUNTER — Other Ambulatory Visit: Payer: Self-pay | Admitting: *Deleted

## 2012-08-10 MED ORDER — METFORMIN HCL 1000 MG PO TABS: 1000.0000 mg | ORAL_TABLET | Freq: Two times a day (BID) | ORAL | Status: DC

## 2013-01-10 ENCOUNTER — Other Ambulatory Visit: Payer: Self-pay | Admitting: *Deleted

## 2013-01-10 DIAGNOSIS — E119 Type 2 diabetes mellitus without complications: Secondary | ICD-10-CM

## 2013-01-12 MED ORDER — METFORMIN HCL 1000 MG PO TABS: 1000.0000 mg | ORAL_TABLET | Freq: Two times a day (BID) | ORAL | Status: DC

## 2013-01-12 MED ORDER — GLIPIZIDE 5 MG PO TABS: 5.0000 mg | ORAL_TABLET | Freq: Two times a day (BID) | ORAL | Status: DC

## 2013-04-21 ENCOUNTER — Other Ambulatory Visit: Payer: Self-pay

## 2013-07-22 ENCOUNTER — Other Ambulatory Visit: Payer: Self-pay | Admitting: *Deleted

## 2013-07-22 DIAGNOSIS — E785 Hyperlipidemia, unspecified: Secondary | ICD-10-CM

## 2013-07-22 NOTE — Telephone Encounter (Signed)
Pt last seen 02/2012 Appointment scheduled for 10/15

## 2013-07-25 MED ORDER — PRAVASTATIN SODIUM 40 MG PO TABS: 40.0000 mg | ORAL_TABLET | Freq: Every evening | ORAL | Status: DC

## 2013-07-25 MED ORDER — METFORMIN HCL 1000 MG PO TABS: 1000.0000 mg | ORAL_TABLET | Freq: Two times a day (BID) | ORAL | Status: DC

## 2013-07-27 ENCOUNTER — Ambulatory Visit (INDEPENDENT_AMBULATORY_CARE_PROVIDER_SITE_OTHER): Payer: No Typology Code available for payment source | Admitting: Internal Medicine

## 2013-07-27 ENCOUNTER — Encounter: Payer: Self-pay | Admitting: Internal Medicine

## 2013-07-27 VITALS — BP 131/90 | HR 77 | Temp 97.1°F | Ht 60.0 in | Wt 183.2 lb

## 2013-07-27 DIAGNOSIS — Z0000 Encounter for general adult medical examination without abnormal findings: Secondary | ICD-10-CM

## 2013-07-27 DIAGNOSIS — Z1211 Encounter for screening for malignant neoplasm of colon: Secondary | ICD-10-CM

## 2013-07-27 DIAGNOSIS — R300 Dysuria: Secondary | ICD-10-CM

## 2013-07-27 DIAGNOSIS — Z1239 Encounter for other screening for malignant neoplasm of breast: Secondary | ICD-10-CM

## 2013-07-27 DIAGNOSIS — E785 Hyperlipidemia, unspecified: Secondary | ICD-10-CM

## 2013-07-27 DIAGNOSIS — Z23 Encounter for immunization: Secondary | ICD-10-CM

## 2013-07-27 DIAGNOSIS — E119 Type 2 diabetes mellitus without complications: Secondary | ICD-10-CM

## 2013-07-27 LAB — GLUCOSE, CAPILLARY: Glucose-Capillary: 205 mg/dL — ABNORMAL HIGH (ref 70–99)

## 2013-07-27 LAB — CBC
Hemoglobin: 14.8 g/dL (ref 12.0–15.0)
MCH: 28.2 pg (ref 26.0–34.0)
RBC: 5.25 MIL/uL — ABNORMAL HIGH (ref 3.87–5.11)
RDW: 13.6 % (ref 11.5–15.5)
WBC: 4.2 10*3/uL (ref 4.0–10.5)

## 2013-07-27 LAB — POCT GLYCOSYLATED HEMOGLOBIN (HGB A1C): Hemoglobin A1C: 8.1

## 2013-07-27 LAB — COMPREHENSIVE METABOLIC PANEL
Albumin: 5 g/dL (ref 3.5–5.2)
Alkaline Phosphatase: 69 U/L (ref 39–117)
BUN: 12 mg/dL (ref 6–23)
CO2: 28 mEq/L (ref 19–32)
Glucose, Bld: 188 mg/dL — ABNORMAL HIGH (ref 70–99)
Potassium: 4.5 mEq/L (ref 3.5–5.3)
Sodium: 136 mEq/L (ref 135–145)
Total Protein: 7.9 g/dL (ref 6.0–8.3)

## 2013-07-27 LAB — LIPID PANEL
Cholesterol: 304 mg/dL — ABNORMAL HIGH (ref 0–200)
HDL: 51 mg/dL (ref >39)
Total CHOL/HDL Ratio: 6 Ratio
VLDL: 75 mg/dL — ABNORMAL HIGH (ref 0–40)

## 2013-07-27 MED ORDER — FREESTYLE SYSTEM KIT: 1.0000 | PACK | Status: DC | PRN

## 2013-07-27 MED ORDER — FREESTYLE LANCETS MISC: Status: DC

## 2013-07-27 MED ORDER — GLUCOSE BLOOD VI STRP: ORAL_STRIP | Status: DC

## 2013-07-27 MED ORDER — GLIPIZIDE 5 MG PO TABS: 15.0000 mg | ORAL_TABLET | Freq: Every day | ORAL | Status: DC

## 2013-07-27 MED ORDER — METFORMIN HCL 1000 MG PO TABS: 1000.0000 mg | ORAL_TABLET | Freq: Two times a day (BID) | ORAL | Status: DC

## 2013-07-27 MED ORDER — PRAVASTATIN SODIUM 40 MG PO TABS: 40.0000 mg | ORAL_TABLET | Freq: Every evening | ORAL | Status: DC

## 2013-07-27 NOTE — Telephone Encounter (Signed)
Rx called in 

## 2013-07-27 NOTE — Assessment & Plan Note (Signed)
Sounds like 1-2 months. Will check UA/micro, but I suspect secondary to vaginal atrophy - pap at next follow up

## 2013-07-27 NOTE — Assessment & Plan Note (Signed)
Flu shot today Mammo and colonoscopy referrals today per family Pap smear at next visit

## 2013-07-27 NOTE — Patient Instructions (Signed)
-  I have put in referrals for your mammogram and colonoscopy -Blood work and flu shot today -Urine studies today -I have put in a referral to the dietician, please make an appointment with Lupita Leash Plyler -Please change your glipizide to 15mg  daily (in the morning)

## 2013-07-27 NOTE — Assessment & Plan Note (Signed)
Check lipids, refill statin

## 2013-07-27 NOTE — Assessment & Plan Note (Signed)
Lab Results  Component Value Date   HGBA1C 8.1 07/27/2013   HGBA1C 8.2 02/20/2012   HGBA1C 7.9 11/13/2010     Assessment: Diabetes control: fair control Progress toward A1C goal:  unchanged Plan: Medications:  continue metformin 1000mg  BID and increase glipizide to 15mg  qAM Home glucose monitoring: Frequency:   Timing:   Instruction/counseling given: reminded to bring medications to each visit, discussed the need for weight loss and discussed diet Educational resources provided: handout Self management tools provided:   Other plans: Lipid and urine microalb today - discuss eye exam at next visit

## 2013-07-27 NOTE — Progress Notes (Signed)
Subjective:   Patient ID: [name] female   DOB: 20-Mar-1951 62 y.o.   MRN: [id number]  HPI: [name] is a very pleasant  62 y.o. spanish speaking woman with history of DM and sarcoidosis of the lung who presents today for routine follow up, last seen over 1 year prior.  She is accompanied by her son and his girlfriend.  She has no complaints today and is here just for a check up. Regarding her DM, she takes glipizide 5mg  BID (but has been out of this medication for the last 2-3 days) and metformin 1000 bid.  She also takes pravastatin, but has been out of this medication for the last 2-3 months.   She goes to the Gym three times/week Breakfast: coffee, toast Lunch: beans, steak Dinner: cereal, eats little  Good appetite and denies symptoms of hypoglycemia including dizziness and light headedness.  She does not check her blood sugar.   She requests mammo and colonoscopy.   Review of Systems: Constitutional: Denies fever, chills, diaphoresis, appetite change and fatigue.  HEENT: Denies photophobia, eye pain, redness, hearing loss, ear pain, congestion, sore throat, rhinorrhea, sneezing, mouth sores, trouble swallowing, neck pain, neck stiffness and tinnitus.  Respiratory: Denies SOB, DOE, cough, chest tightness, and wheezing.  Cardiovascular: Denies chest pain, palpitations and leg swelling.  Gastrointestinal: Denies nausea, vomiting, abdominal pain, diarrhea, constipation,blood in stool and abdominal distention.  Genitourinary: reports mild dysuria for a few months Musculoskeletal: Denies myalgias, back pain, joint swelling, arthralgias and gait problem.  Skin: Denies pallor, rash and wound.  Neurological: Denies dizziness, seizures, syncope, weakness, lightheadedness, numbness and headaches.   Past Medical History  Diagnosis Date  . DM type 2 (diabetes mellitus, type 2) 2006  . 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 Delton Coombes, CT 09/01/2008 - bilateral patchy  airspace disease, mild traction bronchiectasis with lower lobes,    hilar adenopathy, mediastinal adenopathy, approximately the same in degree is noted on 03/09/2008 CT  . Hyperlipidemia    Current Outpatient Prescriptions  Medication Sig Dispense Refill  . glipiZIDE (GLUCOTROL) 5 MG tablet Take 1 tablet (5 mg total) by mouth daily before supper.  60 tablet  11  . glipiZIDE (GLUCOTROL) 5 MG tablet Take 1 tablet (5 mg total) by mouth 2 (two) times daily.  60 tablet  11  . metFORMIN (GLUCOPHAGE) 1000 MG tablet Take 1 tablet (1,000 mg total) by mouth 2 (two) times daily.  60 tablet  4  . pravastatin (PRAVACHOL) 40 MG tablet Take 1 tablet (40 mg total) by mouth every evening.  30 tablet  11  . pravastatin (PRAVACHOL) 40 MG tablet Take 1 tablet (40 mg total) by mouth every evening.  30 tablet  11   No current facility-administered medications for this visit.   Family History  Problem Relation Age of Onset  . Hypertension Mother   . Kidney disease Father   . Breast cancer Maternal Grandfather   . Asthma Paternal Grandfather   . Cancer Maternal Grandmother   . Breast cancer Maternal Grandmother    History   Social History  . Marital Status: Married    Spouse Name: N/A    Number of Children: N/A  . Years of Education: N/A   Social History Main Topics  . Smoking status: Former Smoker -- 0.50 packs/day for 10 years    Types: Cigarettes    Quit date: 02/19/1989  . Smokeless tobacco: Never Used  . Alcohol Use: Yes  Comment: occassional  . Drug Use: No  . Sexual Activity: Yes     Comment: Only sexual partner is husband > 60yrs   Other Topics Concern  . None   Social History Narrative   Financial assistance approved for 70% discount at [facility] and has [facility] card per [employer]   09/20/2010.   Patient lives with husband and son in an apartment and has lived there for 3 years, patient has occasional contact with grandchildren ages 60 and 60. She owns no pets or animals. She has  immigrated from [place] 5 years ago. There is no known exposure to TB      5 children, 3 are in [place]; financial income from son    Objective:  Physical Exam: Filed Vitals:   07/27/13 1600  BP: 131/90  Pulse: 77  Temp: 97.1 F (36.2 C)  TempSrc: Oral  Height: 5' (1.524 m)  Weight: 183 lb 3.2 oz (83.099 kg)  SpO2: 97%   General: appears as stated age HEENT: PERRL, EOMI, no scleral icterus Cardiac: RRR, no rubs, murmurs or gallops Pulm: clear to auscultation bilaterally, moving normal volumes of air Abd: soft, nontender, nondistended, BS normoactive  Ext: warm and well perfused, no pedal edema Neuro: alert and oriented X3, cranial nerves II-XII grossly intact, strength 5/5 b/l UE & LE  Assessment & Plan:  Case and care discussed with Dr. Criselda Peaches.  Please see problem oriented charting for further details. Patient to return in 3 months for DM follow up and pap smear.

## 2013-07-28 LAB — URINALYSIS, ROUTINE W REFLEX MICROSCOPIC
Glucose, UA: NEGATIVE mg/dL
Hgb urine dipstick: NEGATIVE
Nitrite: NEGATIVE
Specific Gravity, Urine: 1.008 (ref 1.005–1.030)
pH: 7 (ref 5.0–8.0)

## 2013-07-28 LAB — MICROALBUMIN / CREATININE URINE RATIO: Microalb Creat Ratio: 15 mg/g (ref 0.0–30.0)

## 2013-07-28 LAB — URINALYSIS, MICROSCOPIC ONLY: Casts: NONE SEEN

## 2013-07-28 NOTE — Progress Notes (Signed)
Case discussed with Dr. Sharda soon after the resident saw the patient.  We reviewed the resident's history and exam and pertinent patient test results.  I agree with the assessment, diagnosis, and plan of care documented in the resident's note. 

## 2013-08-18 ENCOUNTER — Ambulatory Visit (HOSPITAL_COMMUNITY)
Admission: RE | Admit: 2013-08-18 | Discharge: 2013-08-18 | Disposition: A | Discharge status: Home / Self Care | Payer: No Typology Code available for payment source | Source: Ambulatory Visit | Attending: Internal Medicine | Admitting: Internal Medicine

## 2013-08-18 DIAGNOSIS — Z1231 Encounter for screening mammogram for malignant neoplasm of breast: Secondary | ICD-10-CM | POA: Insufficient documentation

## 2013-08-18 DIAGNOSIS — Z1239 Encounter for other screening for malignant neoplasm of breast: Secondary | ICD-10-CM

## 2013-08-31 ENCOUNTER — Encounter: Payer: Self-pay | Admitting: Internal Medicine

## 2013-08-31 ENCOUNTER — Encounter: Payer: Self-pay | Admitting: Dietician

## 2013-11-02 ENCOUNTER — Encounter: Payer: Self-pay | Admitting: Internal Medicine

## 2013-11-02 ENCOUNTER — Encounter: Payer: No Typology Code available for payment source | Admitting: Dietician

## 2013-11-02 NOTE — Progress Notes (Signed)
This encounter was created in error - please disregard.

## 2013-11-02 NOTE — Addendum Note (Signed)
Addended by: Neomia DearPOWERS, Emry Barbato E on: 11/02/2013 07:06 PM   Modules accepted: Orders

## 2013-12-21 ENCOUNTER — Ambulatory Visit (INDEPENDENT_AMBULATORY_CARE_PROVIDER_SITE_OTHER): Payer: No Typology Code available for payment source | Admitting: Internal Medicine

## 2013-12-21 ENCOUNTER — Encounter: Payer: Self-pay | Admitting: Internal Medicine

## 2013-12-21 VITALS — BP 125/79 | HR 78 | Temp 98.0°F | Ht 60.0 in | Wt 184.2 lb

## 2013-12-21 DIAGNOSIS — D869 Sarcoidosis, unspecified: Secondary | ICD-10-CM

## 2013-12-21 DIAGNOSIS — K089 Disorder of teeth and supporting structures, unspecified: Secondary | ICD-10-CM

## 2013-12-21 DIAGNOSIS — E119 Type 2 diabetes mellitus without complications: Secondary | ICD-10-CM

## 2013-12-21 DIAGNOSIS — K0889 Other specified disorders of teeth and supporting structures: Secondary | ICD-10-CM

## 2013-12-21 LAB — HM DIABETES EYE EXAM

## 2013-12-21 LAB — GLUCOSE, CAPILLARY: GLUCOSE-CAPILLARY: 142 mg/dL — AB (ref 70–99)

## 2013-12-21 LAB — POCT GLYCOSYLATED HEMOGLOBIN (HGB A1C): Hemoglobin A1C: 6.9

## 2013-12-21 MED ORDER — BLOOD GLUCOSE TEST VI STRP: 1.0000 | ORAL_STRIP | Status: DC | PRN

## 2013-12-21 MED ORDER — GLIPIZIDE 10 MG PO TABS: 10.0000 mg | ORAL_TABLET | Freq: Two times a day (BID) | ORAL | Status: DC

## 2013-12-21 MED ORDER — BLOOD GLUCOSE METER KIT: PACK | Status: DC

## 2013-12-21 NOTE — Assessment & Plan Note (Signed)
Lab Results  Component Value Date   HGBA1C 6.9 12/21/2013   HGBA1C 8.1 07/27/2013   HGBA1C 8.2 02/20/2012     Assessment: Diabetes control: good control (HgbA1C at goal) Progress toward A1C goal:  at goal Comments: had been taking 2 tabs in morning and 2 tabs in evening of glipizide for total 20 mg qd  Plan: Medications:  continue current medications Home glucose monitoring: Frequency: 2 times a day Timing: before meals Instruction/counseling given: reminded to get eye exam and reminded to bring blood glucose meter & log to each visit Educational resources provided:   Self management tools provided:   Other plans: will continue with 20 mg glipizide.  RX chagned to 10 mg qam and 10 mg qpm with 10 mg pills -Retinal Scan today per Gavin Poundonna Plyer

## 2013-12-21 NOTE — Patient Instructions (Signed)
General Instructions:  Today you had an Eye Exam to check the effect of diabetes on your eye sight. We have given you a new prescription for a diabetes meter. We have change your glipizide diabetes medicine to a 10 mg pill. This means when you get the refill, you will only need to take 1 pill in the morning and 1 in the evening.   We will refer you to the Dental Clinic for your tooth pain. One your next visit with your Primary Doctor you can discuss further follow-up of your lung issues. Return in 3 months.   Treatment Goals:  Goals (1 Years of Data) as of 12/21/13         As of Today 07/27/13 02/20/12     Result Component    . HEMOGLOBIN A1C < 7.0  6.9 8.1 8.2    . HEMOGLOBIN A1C < 7.0  6.9 8.1 8.2    . LDL CALC < 100   178       Progress Toward Treatment Goals:  Treatment Goal 12/21/2013  Hemoglobin A1C at goal    Self Care Goals & Plans:  Self Care Goal 07/27/2013  Manage my medications take my medicines as prescribed; bring my medications to every visit; refill my medications on time  Eat healthy foods drink diet soda or water instead of juice or soda; eat more vegetables; eat foods that are low in salt; eat baked foods instead of fried foods    Home Blood Glucose Monitoring 12/21/2013  Check my blood sugar 2 times a day  When to check my blood sugar before meals     Care Management & Community Referrals:  Referral 07/27/2013  Referrals made for care management support nutritionist; diabetes educator

## 2013-12-21 NOTE — Progress Notes (Signed)
Interpreter Wyvonnia DuskyGraciela Namihira for Dr Towanda OctaveAkousua

## 2013-12-21 NOTE — Assessment & Plan Note (Signed)
Pt's son requesting further f/u on Sarcoidosis.  Last CT chest stable. Calcium 10.1-10.9 over past year. -Pt to discuss further with PCP on f/u.

## 2013-12-21 NOTE — Assessment & Plan Note (Addendum)
Pain in upper right molars.  Pt tending to chew on left side secondary to pain.  Some bleeding on brushing.  No swelling or sore throat.  Multiple fillings from [place] and extracted teeth.  -referral to Dental Clinic

## 2013-12-21 NOTE — Progress Notes (Signed)
   Subjective:    Patient ID: Teresa LoserAmelia Mills, female    DOB: 14-Dec-1950, 63 y.o.   MRN: [id number]  HPI  Presents for f/u of diabetes and tooth pain. She is currently without complaints except right upper tooth pain especially when she chews.  She now tends to chew on her left side to prevent discomfort.  Has multiple fillings and extracted teeth performed in [place].  She was not able to get a glucometer bc the Health Dept could not provide the brand prescribed.    Review of Systems  Constitutional: Negative.   HENT: Positive for dental problem.   Eyes: Negative.   Respiratory: Negative for shortness of breath.   Cardiovascular: Negative for chest pain.  Gastrointestinal: Negative.   Genitourinary: Negative.   Musculoskeletal: Negative.   Skin: Negative.   Neurological: Negative.   Hematological: Does not bruise/bleed easily.  Psychiatric/Behavioral: Negative.        Objective:   Physical Exam  Constitutional: She is oriented to person, place, and time. She appears well-developed and well-nourished. No distress.  Spanish speaking with interpretor as well as son present  HENT:  Head: Normocephalic and atraumatic.  Eyes: Conjunctivae and EOM are normal. Pupils are equal, round, and reactive to light.  Neck: Normal range of motion. Neck supple. No thyromegaly present.  Cardiovascular: Normal rate, regular rhythm, normal heart sounds and intact distal pulses.   No murmur heard. Pulmonary/Chest: Effort normal and breath sounds normal.  Abdominal: Soft. Bowel sounds are normal.  Musculoskeletal: Normal range of motion.  Neurological: She is alert and oriented to person, place, and time.  Skin: Skin is warm and dry.  Psychiatric: She has a normal mood and affect.          Assessment & Plan:  See separate problem list charting:

## 2013-12-22 NOTE — Progress Notes (Signed)
Case discussed with Dr. Schooler at the time of the visit.  We reviewed the resident's history and exam and pertinent patient test results.  I agree with the assessment, diagnosis, and plan of care documented in the resident's note.     

## 2013-12-27 ENCOUNTER — Other Ambulatory Visit: Payer: Self-pay | Admitting: *Deleted

## 2013-12-27 DIAGNOSIS — E119 Type 2 diabetes mellitus without complications: Secondary | ICD-10-CM

## 2013-12-27 NOTE — Telephone Encounter (Signed)
Pt is getting strips from [facility] so needs brand, # of time she test, Dx code  Also sig reads - 1 strip by in vitro route ??

## 2013-12-28 NOTE — Telephone Encounter (Signed)
Called the mobile number listed and spoke with son.  He was not with her at that moment.  He states she does not speak any [place].  I therefore asked him what brand of strips she uses.  He is not sure but when he gets home tonight he will call the clinic and leave a message with the brand name so we may complete the request.

## 2014-01-04 MED ORDER — BLOOD GLUCOSE TEST VI STRP: 1.0000 | ORAL_STRIP | Freq: Every day | Status: DC

## 2014-03-01 ENCOUNTER — Encounter: Payer: Self-pay | Admitting: *Deleted

## 2014-03-22 ENCOUNTER — Encounter: Payer: Self-pay | Admitting: Internal Medicine

## 2014-03-22 ENCOUNTER — Ambulatory Visit (INDEPENDENT_AMBULATORY_CARE_PROVIDER_SITE_OTHER): Payer: No Typology Code available for payment source | Admitting: Internal Medicine

## 2014-03-22 VITALS — BP 124/78 | HR 91 | Temp 96.6°F | Wt 180.2 lb

## 2014-03-22 DIAGNOSIS — K089 Disorder of teeth and supporting structures, unspecified: Secondary | ICD-10-CM

## 2014-03-22 DIAGNOSIS — Z0000 Encounter for general adult medical examination without abnormal findings: Secondary | ICD-10-CM

## 2014-03-22 DIAGNOSIS — E785 Hyperlipidemia, unspecified: Secondary | ICD-10-CM

## 2014-03-22 DIAGNOSIS — K0889 Other specified disorders of teeth and supporting structures: Secondary | ICD-10-CM

## 2014-03-22 DIAGNOSIS — E119 Type 2 diabetes mellitus without complications: Secondary | ICD-10-CM

## 2014-03-22 DIAGNOSIS — Z1211 Encounter for screening for malignant neoplasm of colon: Secondary | ICD-10-CM

## 2014-03-22 LAB — POCT GLYCOSYLATED HEMOGLOBIN (HGB A1C): Hemoglobin A1C: 8.4

## 2014-03-22 LAB — GLUCOSE, CAPILLARY: GLUCOSE-CAPILLARY: 103 mg/dL — AB (ref 70–99)

## 2014-03-22 MED ORDER — BLOOD GLUCOSE TEST VI STRP: 1.0000 | ORAL_STRIP | Freq: Every day | Status: DC

## 2014-03-22 MED ORDER — FREESTYLE LANCETS MISC: Status: DC

## 2014-03-22 MED ORDER — GLIPIZIDE 10 MG PO TABS
10.0000 mg | ORAL_TABLET | Freq: Two times a day (BID) | ORAL | Status: DC
Start: 2014-03-22 — End: 2015-04-03

## 2014-03-22 NOTE — Patient Instructions (Signed)
Diabetes: 1. Continue metformin 1000mg  twice daily 2. Increase Glipizide to 10mg  twice daily, try not to miss doses (you may finish your current supply by taking two 5mg  pills twice a day, but your next refill should be a 10mg  pill that you can take one pill twice a day) 3. Make an appointment with our life coach/nutritionist, Norm Parcel - she will also teach you how to use your meter. Before this appointment, please pick up the glucose test strips and lancets (to prick your skin).  I have also placed a referral so you may have your screening colonoscopy done to be sure you do not have colon cancer.    Thank you for bringing your medicines today. This helps [place] keep you safe from mistakes.  Please be sure to bring all of your medications with you to every visit.  Should you have any new or worsening symptoms, please be sure to call the clinic at [phone].

## 2014-03-22 NOTE — Progress Notes (Signed)
Subjective:   Patient ID: [name] female   DOB: 11-26-1950 63 y.o.   MRN: [id number]  Chief Complaint  Patient presents with  . Dental Pain    broken molars both side on the top  . Diabetes   HPI: [name] is a 63 y.o. woman with h/o sarcoidosis, HLD, DM who presents for routine.    Seen in pap clinic on 03/20/14.    Regarding DM, she has meter (one touch ultra mini) but doesn't know how to work it.  She reports she may miss glipizide 3 times per week.  Regarding meds, she has been taking metformin 1000 bid and glipizide 15 daily.  Reports hypoglycemic episodes if she doesn't eat (SOB) - doesn't happen often, maybe once per week, better with eating.     Breakfast: coffee, oatmeal, toast Lunch: [place] -- heaviest meal  Dinner: cereal   Snacks: watermelon during the summer, fruits  Exercise: 3 times per week  Never met with Butch Penny.   HM: colonoscopy  Review of Systems: Constitutional: Denies fever, chills, diaphoresis, appetite change and fatigue.  HEENT: Denies photophobia, eye pain, redness, hearing loss, ear pain, congestion, sore throat, rhinorrhea, sneezing, mouth sores, trouble swallowing, neck pain, neck stiffness and tinnitus.  Respiratory: Denies SOB, DOE, cough, chest tightness, and wheezing.  Cardiovascular: Denies chest pain, palpitations and leg swelling.  Gastrointestinal: Denies nausea, vomiting, abdominal pain, diarrhea, constipation,blood in stool and abdominal distention.  Genitourinary: Denies dysuria, urgency, frequency, hematuria, flank pain and difficulty urinating.  Musculoskeletal: Denies myalgias, back pain, joint swelling, arthralgias and gait problem.  Skin: Denies pallor, rash and wound.  Neurological: Denies dizziness, seizures, syncope, weakness, lightheadedness, numbness and headaches.  Hematological: no obvious s/s of bleeding  Past Medical History  Diagnosis Date  . DM type 2 (diabetes mellitus, type 2) 2006  . Sarcoidosis of  lung 04/27/08    Followed by Dr. Lamonte Sakai, CT 09/01/2008 - bilateral patchy airspace disease, mild traction bronchiectasis with lower lobes,    hilar adenopathy, mediastinal adenopathy, approximately the same in degree is noted on 03/09/2008 CT  . Hyperlipidemia    Current Outpatient Prescriptions  Medication Sig Dispense Refill  . Blood Glucose Monitoring Suppl (BLOOD GLUCOSE METER) kit Use as instructed  1 each  0  . glipiZIDE (GLUCOTROL) 10 MG tablet Take 1 tablet (10 mg total) by mouth 2 (two) times daily before a meal.  60 tablet  11  . Glucose Blood (BLOOD GLUCOSE TEST STRIPS) STRP 1 strip by Other route daily. Use 1 strip TIDWC to check CBG  100 each  3  . Lancets (FREESTYLE) lancets Use as instructed  100 each  12  . metFORMIN (GLUCOPHAGE) 1000 MG tablet Take 1 tablet (1,000 mg total) by mouth 2 (two) times daily.  60 tablet  11  . pravastatin (PRAVACHOL) 40 MG tablet Take 1 tablet (40 mg total) by mouth every evening.  30 tablet  11  . pravastatin (PRAVACHOL) 40 MG tablet Take 1 tablet (40 mg total) by mouth every evening.  30 tablet  11   No current facility-administered medications for this visit.   Family History  Problem Relation Age of Onset  . Hypertension Mother   . Kidney disease Father   . Breast cancer Maternal Grandfather   . Asthma Paternal Grandfather   . Cancer Maternal Grandmother   . Breast cancer Maternal Grandmother    History   Social History  . Marital Status: Married    Spouse Name: N/A  Number of Children: N/A  . Years of Education: N/A   Social History Main Topics  . Smoking status: Former Smoker -- 0.50 packs/day for 10 years    Types: Cigarettes    Quit date: 02/19/1989  . Smokeless tobacco: Never Used  . Alcohol Use: Yes     Comment: occassional  . Drug Use: No  . Sexual Activity: Yes     Comment: Only sexual partner is husband > 62yr   Other Topics Concern  . Not on file   Social History Narrative   [facility]  [facility].   Patient lives with husband and son in an apartment and has lived there for 3 years, patient has occasional contact with grandchildren ages 575and 975 She owns no pets or animals. [place]. There is no known exposure to TB      5 children, 3 are in ; financial income from son    Objective:  Physical Exam: Filed Vitals:   03/22/14 1552  BP: 124/78  Pulse: 91  Temp: 96.6 F (35.9 C)  TempSrc: Oral  Weight: 180 lb 3.2 oz (81.738 kg)  SpO2: 96%   General: pleasant, appears as stated age HEENT: PERRL, EOMI, no scleral icterus, poor dentition with missing molars on top, chipped teeth Cardiac: RRR, no rubs, murmurs or gallops Pulm: clear to auscultation bilaterally, moving normal volumes of air Abd: soft, nontender, nondistended, BS normoactive Ext: warm and well perfused, no pedal edema Neuro: alert and oriented X3, cranial nerves II-XII grossly intact  Assessment & Plan:  Case and care discussed with Dr. RStann Mainland  Please see problem oriented charting for further details. Patient to return in 3 months for routine DM follow up.

## 2014-03-23 ENCOUNTER — Encounter: Payer: Self-pay | Admitting: Internal Medicine

## 2014-03-23 DIAGNOSIS — Z0000 Encounter for general adult medical examination without abnormal findings: Secondary | ICD-10-CM | POA: Insufficient documentation

## 2014-03-23 MED ORDER — LANCETS 30G MISC: Status: AC

## 2014-03-23 MED ORDER — BLOOD GLUCOSE METER KIT: PACK | Status: AC

## 2014-03-23 MED ORDER — GLUCOSE BLOOD VI STRP: ORAL_STRIP | Status: AC

## 2014-03-23 NOTE — Assessment & Plan Note (Signed)
Referral to dental clinic again.

## 2014-03-23 NOTE — Assessment & Plan Note (Signed)
Agreeable to colonoscopy.  Plan to place referral when [facility] will cover.

## 2014-03-23 NOTE — Assessment & Plan Note (Signed)
Patients LDL 178, and she has not been taking pravastatin due to myalgias.  Her 10 year ASCVD score is 9.2% in this 63 yo F.  At next visit, if we can find a way for her to afford rosuvastatin, may consider trial of this medication.

## 2014-03-23 NOTE — Assessment & Plan Note (Signed)
Lab Results  Component Value Date   HGBA1C 8.4 03/22/2014   HGBA1C 6.9 12/21/2013   HGBA1C 8.1 07/27/2013     Assessment: Diabetes control:  fair control Progress toward A1C goal:   deteriorated Comments: patient had been taking glipizide 15 daily (missing 3 doses/week), but regularly takes metformin 1000 bid  Plan: Medications:  Asked her to increase glipizide to 10mg  BID (as had been done on last visit) and cont metformin 1000bid - encouraged compliance so we would know what to do next if A1c remains out of control, but to note, 3 months ago was better controlled Home glucose monitoring: Frequency:  as needed Instruction/counseling given: reminded to bring blood glucose meter & log to each visit and reminded to bring medications to each visit Other plans: referred to [facility] for M[facility], also for meter education - new strips/meter/lancets sent in for afforability

## 2014-04-10 ENCOUNTER — Ambulatory Visit: Payer: No Typology Code available for payment source | Admitting: Dietician

## 2014-04-17 NOTE — Addendum Note (Signed)
Addended by: Neomia DearPOWERS, Telsa Dillavou E on: 04/17/2014 06:42 PM   Modules accepted: Orders

## 2014-05-03 NOTE — Progress Notes (Signed)
I discussed this visit with Dr. Sharda and agree with her notes and plans. 

## 2014-05-25 ENCOUNTER — Encounter (HOSPITAL_COMMUNITY): Payer: Self-pay

## 2014-06-05 ENCOUNTER — Ambulatory Visit (INDEPENDENT_AMBULATORY_CARE_PROVIDER_SITE_OTHER): Payer: No Typology Code available for payment source | Admitting: Internal Medicine

## 2014-06-05 ENCOUNTER — Telehealth: Payer: Self-pay | Admitting: *Deleted

## 2014-06-05 ENCOUNTER — Encounter: Payer: Self-pay | Admitting: Internal Medicine

## 2014-06-05 VITALS — BP 126/83 | HR 73 | Temp 98.3°F | Ht 60.0 in | Wt 184.9 lb

## 2014-06-05 DIAGNOSIS — E119 Type 2 diabetes mellitus without complications: Secondary | ICD-10-CM

## 2014-06-05 DIAGNOSIS — M545 Low back pain, unspecified: Secondary | ICD-10-CM

## 2014-06-05 DIAGNOSIS — R300 Dysuria: Secondary | ICD-10-CM | POA: Insufficient documentation

## 2014-06-05 DIAGNOSIS — M79609 Pain in unspecified limb: Secondary | ICD-10-CM

## 2014-06-05 DIAGNOSIS — K219 Gastro-esophageal reflux disease without esophagitis: Secondary | ICD-10-CM

## 2014-06-05 DIAGNOSIS — M79604 Pain in right leg: Secondary | ICD-10-CM | POA: Insufficient documentation

## 2014-06-05 LAB — GLUCOSE, CAPILLARY: Glucose-Capillary: 146 mg/dL — ABNORMAL HIGH (ref 70–99)

## 2014-06-05 LAB — CBC WITH DIFFERENTIAL/PLATELET
BASOS ABS: 0.1 10*3/uL (ref 0.0–0.1)
BASOS PCT: 2 % — AB (ref 0–1)
Eosinophils Absolute: 0.1 10*3/uL (ref 0.0–0.7)
Eosinophils Relative: 3 % (ref 0–5)
HEMATOCRIT: 41 % (ref 36.0–46.0)
Hemoglobin: 14.3 g/dL (ref 12.0–15.0)
Lymphocytes Relative: 22 % (ref 12–46)
Lymphs Abs: 0.7 10*3/uL (ref 0.7–4.0)
MCH: 28 pg (ref 26.0–34.0)
MCHC: 34.9 g/dL (ref 30.0–36.0)
MCV: 80.2 fL (ref 78.0–100.0)
Monocytes Absolute: 0.2 10*3/uL (ref 0.1–1.0)
Monocytes Relative: 7 % (ref 3–12)
Neutro Abs: 2 10*3/uL (ref 1.7–7.7)
Neutrophils Relative %: 66 % (ref 43–77)
Platelets: 235 10*3/uL (ref 150–400)
RBC: 5.11 MIL/uL (ref 3.87–5.11)
RDW: 14.7 % (ref 11.5–15.5)
WBC: 3.1 10*3/uL — ABNORMAL LOW (ref 4.0–10.5)

## 2014-06-05 LAB — COMPLETE METABOLIC PANEL WITH GFR
ALK PHOS: 54 U/L (ref 39–117)
ALT: 21 U/L (ref 0–35)
AST: 22 U/L (ref 0–37)
Albumin: 4.9 g/dL (ref 3.5–5.2)
BILIRUBIN TOTAL: 0.7 mg/dL (ref 0.2–1.2)
BUN: 14 mg/dL (ref 6–23)
CO2: 28 meq/L (ref 19–32)
CREATININE: 0.63 mg/dL (ref 0.50–1.10)
Calcium: 9.4 mg/dL (ref 8.4–10.5)
Chloride: 102 mEq/L (ref 96–112)
GFR, Est Non African American: >89 mL/min
Glucose, Bld: 135 mg/dL — ABNORMAL HIGH (ref 70–99)
Potassium: 4.2 mEq/L (ref 3.5–5.3)
SODIUM: 138 meq/L (ref 135–145)
TOTAL PROTEIN: 7.7 g/dL (ref 6.0–8.3)

## 2014-06-05 MED ORDER — RANITIDINE HCL 150 MG PO TABS: 150.0000 mg | ORAL_TABLET | Freq: Two times a day (BID) | ORAL | Status: DC

## 2014-06-05 MED ORDER — CYCLOBENZAPRINE HCL 5 MG PO TABS: 5.0000 mg | ORAL_TABLET | Freq: Two times a day (BID) | ORAL | Status: DC | PRN

## 2014-06-05 NOTE — Patient Instructions (Addendum)
Please take ibuprofen  up to three times a day as needed for pain. I have also prescribed flexeril  that you can take up to twice a day as needed for pain. Flexeril can make you drowsy, if so you can take it at night.  For your stomach pain start taking zantac  twice a day.   I ordered MRI imaging for your back and will see you in two weeks or sooner depending on how soon you can get your MRI. If your back pain worsens call the clinic or go to the ED.   Also, no heavy lifting, straining, or going to the gym until pain resolves.

## 2014-06-05 NOTE — Telephone Encounter (Signed)
Pt presents to the front desk c/o not being able to go to ED for back pain because [facility] card does not pay for visit to ED. After speaking w/ interp this is determined that she was not refused ED care just can not pay for it. She is c/o back pain x 15 days radiating to R with numbness down R leg. She has been taking 4 ibuprofen daily with some relief, denies injuries recently but does say she had a fall in April on a wet floor and landing on her buttocks but no serious pain afterwards. She desires to be seen. Placed in open slot dr Danella Penton 0930, informed lelas. And dr Meredith Pel

## 2014-06-05 NOTE — Telephone Encounter (Signed)
Agree with plan 

## 2014-06-06 LAB — URINALYSIS, ROUTINE W REFLEX MICROSCOPIC
Bilirubin Urine: NEGATIVE
GLUCOSE, UA: NEGATIVE mg/dL
HGB URINE DIPSTICK: NEGATIVE
Ketones, ur: NEGATIVE mg/dL
LEUKOCYTES UA: NEGATIVE
Nitrite: NEGATIVE
PROTEIN: NEGATIVE mg/dL
Specific Gravity, Urine: 1.009 (ref 1.005–1.030)
Urobilinogen, UA: 0.2 mg/dL (ref 0.0–1.0)
pH: 6.5 (ref 5.0–8.0)

## 2014-06-08 DIAGNOSIS — K219 Gastro-esophageal reflux disease without esophagitis: Secondary | ICD-10-CM

## 2014-06-08 HISTORY — DX: Gastro-esophageal reflux disease without esophagitis: K21.9

## 2014-06-08 NOTE — Assessment & Plan Note (Signed)
Pt reports dysuria and also having back pain although unlikely the two are associated.   - ordered UA which came back negative for nitrites, LE, and no bacteria was noted

## 2014-06-08 NOTE — Progress Notes (Signed)
Subjective:     Patient ID: [name], female   DOB: 12-Aug-1951, 63 y.o.   MRN: [id number]  HPI Pt is a 63 y/o female w/ PMHx of HLD, DM2, and sarcoidosis who presents to clinic for back pain x 15 days. Pain starts at lower back and radiates down right leg. While walking 3 days ago she noted that pain worsened. She rates pain as 9/10 and describes pain as a pins and needles type sensation w/ numbness. She has tried ibuprofen w/o much relief in pain. Pain is constant and her family got concerned when she started having difficulty w/ ambulation since she is an active person and likes to go to the gym. When pt stands up straight she notices pain and when she bends forwards pain eases a little. She did have a fall in April but does not believe it to be the cause of her current pain as she has been able to go to the gym w/o any problems after April until recent episode of back pain. Denies difficulty w/ BM. She endorses dysuria.    She also reports a tight sensation in her abdomen with a bloated feeling. She does not know if this is associated with back pain and says that b/c of her back she is noticing everything.    Review of Systems  Constitutional: Positive for activity change.  Respiratory: Negative for shortness of breath.   Cardiovascular: Negative for chest pain.  Gastrointestinal: Positive for abdominal distention.  Musculoskeletal: Positive for back pain. Negative for joint swelling.       Objective:   Physical Exam  Constitutional: She appears well-developed and well-nourished.  Cardiovascular: Normal rate and regular rhythm.   Pulmonary/Chest: Effort normal and breath sounds normal.  Abdominal: Soft. Bowel sounds are normal. There is no tenderness.  Musculoskeletal:  Positive straight leg test, no pain on internal or external rotation of rt hip, unable to bend down to touch toes, intact sensation, equal foot strength b/l, tender to palpation of lower spine above ischial crest area,  no bruises noted on back.        Assessment:     Please see problem based assessment and plan.        Plan:     Please see problem based assessment and plan.

## 2014-06-08 NOTE — Assessment & Plan Note (Addendum)
Pt reports back pain x 15 days w/ acute worsening over the past 3 days. No concerning symptoms for cauda equina syndrome. Likely pain due to nerve impingement.   -ordered MRI of back - flexeril  BID prn for pain, cautioned about drowsy side effects of medication - instructed to continue ibuprofen  TID prn for pain - CBC w/ diff in case of infectious etiology or malignancy (results came back w/ low WBC but otherwise stable from previous visits) - will f/u in 2 weeks or sooner if pt gets her MRI before then.

## 2014-06-08 NOTE — Assessment & Plan Note (Signed)
Pt reports a feeling of stomach tightness w/ bloated feeling. Likely due to GERD. Will start trial H2 blocker.  -rx for zantac  BID

## 2014-06-09 NOTE — Progress Notes (Signed)
Internal Medicine Clinic Attending Date of visit: 06/05/2014  I saw and evaluated the patient.  I personally confirmed the key portions of the history and exam documented by Dr. Truong and I reviewed pertinent patient test results.  The assessment, diagnosis, and plan were formulated together and I agree with the documentation in the resident's note. 

## 2014-06-15 ENCOUNTER — Ambulatory Visit (HOSPITAL_COMMUNITY)
Admission: RE | Admit: 2014-06-15 | Discharge: 2014-06-15 | Disposition: A | Discharge status: Home / Self Care | Payer: Self-pay | Source: Ambulatory Visit | Attending: Internal Medicine | Admitting: Internal Medicine

## 2014-06-15 DIAGNOSIS — M545 Low back pain, unspecified: Secondary | ICD-10-CM

## 2014-06-15 DIAGNOSIS — M5137 Other intervertebral disc degeneration, lumbosacral region: Secondary | ICD-10-CM | POA: Insufficient documentation

## 2014-06-15 DIAGNOSIS — M51379 Other intervertebral disc degeneration, lumbosacral region without mention of lumbar back pain or lower extremity pain: Secondary | ICD-10-CM | POA: Insufficient documentation

## 2014-06-15 DIAGNOSIS — M47817 Spondylosis without myelopathy or radiculopathy, lumbosacral region: Secondary | ICD-10-CM | POA: Insufficient documentation

## 2014-06-15 DIAGNOSIS — M5126 Other intervertebral disc displacement, lumbar region: Secondary | ICD-10-CM | POA: Insufficient documentation

## 2014-06-20 ENCOUNTER — Ambulatory Visit: Payer: No Typology Code available for payment source | Admitting: Internal Medicine

## 2014-06-27 ENCOUNTER — Encounter: Payer: Self-pay | Admitting: Internal Medicine

## 2014-06-27 ENCOUNTER — Ambulatory Visit (INDEPENDENT_AMBULATORY_CARE_PROVIDER_SITE_OTHER): Payer: No Typology Code available for payment source | Admitting: Internal Medicine

## 2014-06-27 VITALS — BP 120/80 | HR 70 | Temp 98.4°F | Ht 60.0 in | Wt 184.0 lb

## 2014-06-27 DIAGNOSIS — Z0000 Encounter for general adult medical examination without abnormal findings: Secondary | ICD-10-CM

## 2014-06-27 DIAGNOSIS — E119 Type 2 diabetes mellitus without complications: Secondary | ICD-10-CM

## 2014-06-27 DIAGNOSIS — M545 Low back pain, unspecified: Secondary | ICD-10-CM

## 2014-06-27 DIAGNOSIS — E785 Hyperlipidemia, unspecified: Secondary | ICD-10-CM

## 2014-06-27 DIAGNOSIS — K219 Gastro-esophageal reflux disease without esophagitis: Secondary | ICD-10-CM

## 2014-06-27 LAB — POCT GLYCOSYLATED HEMOGLOBIN (HGB A1C): Hemoglobin A1C: 6.9

## 2014-06-27 LAB — GLUCOSE, CAPILLARY: GLUCOSE-CAPILLARY: 169 mg/dL — AB (ref 70–99)

## 2014-06-27 MED ORDER — RANITIDINE HCL 150 MG PO TABS: 150.0000 mg | ORAL_TABLET | Freq: Two times a day (BID) | ORAL | Status: DC

## 2014-06-27 MED ORDER — LOVASTATIN 40 MG PO TABS: 40.0000 mg | ORAL_TABLET | Freq: Every day | ORAL | Status: DC

## 2014-06-27 MED ORDER — METFORMIN HCL 1000 MG PO TABS: 1000.0000 mg | ORAL_TABLET | Freq: Two times a day (BID) | ORAL | Status: DC

## 2014-06-27 NOTE — Assessment & Plan Note (Signed)
She continues to have heartburn with mild epigastric tenderness. She was not taking Zantac everyday.  -Pt encouraged to take Zantac  BID every day, especially when taking Ibuprofen.

## 2014-06-27 NOTE — Assessment & Plan Note (Signed)
Lab Results  Component Value Date   HGBA1C 6.9 06/27/2014   HGBA1C 8.4 03/22/2014   HGBA1C 6.9 12/21/2013     Assessment: Diabetes control: good control (HgbA1C at goal) Progress toward A1C goal:  at goal Comments: She is on metformin  BID and glipizide  BIDac. She exercises in the gym 1-2 per week. Her HgbA1C today is 6.9%, improved from last check of 8.4%.  Plan: Medications:  continue current medications Home glucose monitoring: Frequency: no home glucose monitoring Timing:   Instruction/counseling given: reminded to bring medications to each visit and discussed diet Educational resources provided:   Self management tools provided:   Other plans: Follow up in 3 months. Sent refill Rx for her metformin. Started statin.

## 2014-06-27 NOTE — Assessment & Plan Note (Addendum)
Last lipid panel with LDL of 178, 10-yr ACD score is 9.2%. She did not tolerated pravastatin due to myalgia.  She is not sure she can afford rouvastatin.  Rx lovastatin  daily (on $4 [facility] list)

## 2014-06-27 NOTE — Assessment & Plan Note (Signed)
The pain has resolved.  Pt counseled on how to correctly lift items and how to protect her back.  She will take Ibuprofen  PRN for back pain She was given back stretch excercises

## 2014-06-27 NOTE — Assessment & Plan Note (Signed)
She needs a colonoscopy and flu vaccine but will address this during her next visit.

## 2014-06-27 NOTE — Patient Instructions (Addendum)
General Instructions: -Start taking Zantac  twice per day every day.  -Take Ibuprofen  every 6 hours as needed for low back pain.  -Start taking lovastatin (mevachor)  every day for your cholesterol.  -Follow up in 3 months for diabetes.   Thank you for bringing your medicines today. This helps [place] keep you safe from mistakes.   -Siga tomando  Zantac 150 mg dos veces al [employer]. -Tome Ibuprofeno 400 mg cada 6 horas se sea necesario para el dolor de espalda. -Empieza a tomar lovastatina Holiday representative )  cada da para el colesterol . -Haga una cita de seguimiento en 3 meses para la diabetes .  Gracias por traer sus medicamentos hoy . Esto nos ayuda a mantener a salvo de errores.   Treatment Goals:  Goals (1 Years of Data) as of 06/27/14         As of Today 03/22/14 12/21/13 07/27/13 02/20/12     Result Component    . HEMOGLOBIN A1C < 7.0  6.9 8.4 6.9 8.1 8.2    . HEMOGLOBIN A1C < 7.0  6.9 8.4 6.9 8.1 8.2    . LDL CALC < 100     178       Progress Toward Treatment Goals:  Treatment Goal 06/27/2014  Hemoglobin A1C at goal    Self Care Goals & Plans:  Self Care Goal 06/27/2014  Manage my medications take my medicines as prescribed; bring my medications to every visit; refill my medications on time  Monitor my health -  Eat healthy foods -  Meeting treatment goals maintain the current self-care plan    Home Blood Glucose Monitoring 06/27/2014  Check my blood sugar no home glucose monitoring  When to check my blood sugar -     Care Management & Community Referrals:  Referral 06/27/2014  Referrals made for care management support none needed     Radiculopata lumbosacra (Lumbosacral Radiculopathy) Le han diagnosticado una radiculopata lumbosacra. Significa que un nervio est comprimido en la zona inferior de la espalda (rea lumbosacra). Cuando esto ocurre, puede sentir debilidad en las piernas y ser incapaz de mantenerse parado en puntas de pie.  Puede sentir Chief Strategy Officer corre por las piernas [place]. Puede tener dificultad para caminar normalmente. Hay numerosas causas que originan este problema. Algunas veces es consecuencia de una lesin o simplemente es debido a artritis o problemas seos. Otra de las causas puede ser una enfermedad, como la diabetes. Si no mejora luego del tratamiento, deber realizarse estudios ms profundos para hallar la causa exacta. DIAGNSTICO Puede ser necesario que le tomen radiografas si los problemas persisten durante mucho tiempo. Podrn realizarle una electromiografa. En este examen se estudia el funcionamiento de los nervios y los msculos. INSTRUCCIONES PARA EL CUIDADO DOMICILIARIO  Puede aliviarlo la aplicacin de una bolsa con hielo. Coloque el hielo en una bolsa plstica y envulvala en una toalla para prevenir el congelamiento de la piel. Podr aplicarlo cada 2 horas durante 20  30 minutos, o cuando lo crea necesario, mientras se encuentre despierto, o segn se lo haya indicado el profesional que lo asiste.  Si luego de Corporate treasurer y fro observa que uno le hace mejor que el otro, contine con el que le trae [place].  Utilice los medicamentos de venta libre o de prescripcin para Chief Technology Officer, Environmental health practitioner o la [place], segn se lo indique el profesional que lo asiste.  Si le indican fisioterapia, siga las indicaciones de su  mdicoRomona Curls ATENCIN MDICA DE INMEDIATO SI:  El dolor no se alivia con los [employer].  Parece empeorar ms que mejorar.  Aumenta la debilidad en las piernas.  Pierde el control de la vejiga o del intestino.  Tiene dificultad para caminar o para mantener el equilibrio o torpeza en el uso de las piernas.  Tiene fiebre. EST SEGURO QUE:   Comprende las instrucciones para el alta mdica.  Controlar su enfermedad.  Solicitar atencin mdica de inmediato segn las indicaciones. Document Released: 07/09/2005 Document Revised: 12/22/2011 [facility]  Patient Information 2015 [place], [place]. This information is not intended to replace advice given to you by your health care provider. Make sure you discuss any questions you have with your health care provider.

## 2014-06-27 NOTE — Progress Notes (Signed)
   Subjective:    Patient ID: [name], female    DOB: Apr 30, 1951, 63 y.o.   MRN: [id number]  HPI [name] is a 63 year old Spanish speaking woman with PMH of DM2, HLP, GERD, presenting, accompanied by a Spanish interpreter, for follow up visit for low back pain with radiculopathy.  Her pain started approximately 1 month ago in the lower back. It worsened but then improved. She was seen at the [facility] on [facility]/05/14, was prescribed flexeril and Ibuprofen. She had MRI done that revealed mild nerve impingement at L4-L5, L5-S1 and right disc protrusion at L5-S1.   The lower back has completely resolved about one week ago. She now remembers that the pain was precipitated by something heavy that she lifted.    Review of Systems  Constitutional: Negative for fever, chills, diaphoresis, activity change, appetite change, fatigue and unexpected weight change.  Respiratory: Negative for cough, shortness of breath and wheezing.   Cardiovascular: Negative for chest pain, palpitations and leg swelling.  Gastrointestinal:       Heartburn with burning sensation  Genitourinary: Negative for dysuria, frequency and difficulty urinating.  Musculoskeletal: Positive for back pain. Negative for myalgias.  Skin: Negative for rash.  Neurological: Negative for dizziness and light-headedness.  Psychiatric/Behavioral: Positive for sleep disturbance. Negative for agitation. The patient is not nervous/anxious.        "light" sleep, wakes up easily and has difficulty falling asleep at times.        Objective:   Physical Exam  Nursing note and vitals reviewed. Constitutional: She is oriented to person, place, and time. She appears well-developed and well-nourished. No distress.  Obese  Cardiovascular: Normal rate and regular rhythm.   Pulmonary/Chest: Effort normal and breath sounds normal. No respiratory distress. She has no wheezes. She has no rales.  Abdominal: Soft. She exhibits no distension and no mass.  There is no guarding.  Mild epigastric tenderness  Musculoskeletal: Normal range of motion. She exhibits no edema and no tenderness.  Neurological: She is alert and oriented to person, place, and time. Coordination normal.  Intact sensation bilaterally, ship flexion/entesion nl with 5/5 strength bilaterally, straight leg raise test positive in left side.   Skin: Skin is warm and dry. No rash noted. She is not diaphoretic.  Psychiatric: She has a normal mood and affect. Her behavior is normal.          Assessment & Plan:

## 2014-07-03 NOTE — Progress Notes (Signed)
Internal Medicine Clinic Attending  Case discussed with Dr. Kennerly soon after the resident saw the patient.  We reviewed the resident's history and exam and pertinent patient test results.  I agree with the assessment, diagnosis, and plan of care documented in the resident's note.  

## 2014-07-11 ENCOUNTER — Encounter: Payer: Self-pay | Admitting: *Deleted

## 2014-09-21 ENCOUNTER — Encounter: Payer: Self-pay | Admitting: Pulmonary Disease

## 2014-09-28 ENCOUNTER — Ambulatory Visit: Payer: Self-pay

## 2014-11-15 ENCOUNTER — Ambulatory Visit: Payer: Self-pay

## 2014-11-30 ENCOUNTER — Encounter: Payer: Self-pay | Admitting: Internal Medicine

## 2014-11-30 ENCOUNTER — Ambulatory Visit (INDEPENDENT_AMBULATORY_CARE_PROVIDER_SITE_OTHER): Payer: Self-pay | Admitting: Internal Medicine

## 2014-11-30 VITALS — BP 138/77 | HR 82 | Temp 98.0°F | Ht 60.0 in | Wt 181.0 lb

## 2014-11-30 DIAGNOSIS — R350 Frequency of micturition: Secondary | ICD-10-CM

## 2014-11-30 DIAGNOSIS — E119 Type 2 diabetes mellitus without complications: Secondary | ICD-10-CM

## 2014-11-30 DIAGNOSIS — Z0000 Encounter for general adult medical examination without abnormal findings: Secondary | ICD-10-CM

## 2014-11-30 DIAGNOSIS — Z23 Encounter for immunization: Secondary | ICD-10-CM

## 2014-11-30 LAB — POCT URINALYSIS DIPSTICK
BILIRUBIN UA: NEGATIVE
Blood, UA: NEGATIVE
GLUCOSE UA: NEGATIVE
KETONES UA: NEGATIVE
Nitrite, UA: NEGATIVE
Protein, UA: NEGATIVE
SPEC GRAV UA: 1.025
Urobilinogen, UA: 0.2
pH, UA: 6

## 2014-11-30 LAB — POCT GLYCOSYLATED HEMOGLOBIN (HGB A1C): HEMOGLOBIN A1C: 7.2

## 2014-11-30 LAB — GLUCOSE, CAPILLARY: GLUCOSE-CAPILLARY: 129 mg/dL — AB (ref 70–99)

## 2014-11-30 NOTE — Progress Notes (Signed)
   Subjective:    Patient ID: Teresa LoserAmelia Mills, female    DOB: October 16, 1950, 64 y.o.   MRN: [id number]  HPI [name] is a 64 yr old woman with PMH of DM2, HLP, GERD, who presents accompanied by her adult son as a Actuarypanish interpreter, for routine diabetes follow up visit. In addition she complains of urinary urgency for the past month with no dysuria or polyuria. She states that she has been feeling well but has not been exercising as she used to and has gained a few pounds.     Review of Systems  Constitutional: Negative for fever, chills, diaphoresis, activity change, appetite change and fatigue.  Respiratory: Negative for cough, shortness of breath and wheezing.   Cardiovascular: Negative for chest pain, palpitations and leg swelling.  Gastrointestinal: Negative for nausea, abdominal pain and diarrhea.  Genitourinary: Positive for urgency and frequency. Negative for dysuria, flank pain, decreased urine volume, vaginal discharge and difficulty urinating.  Musculoskeletal: Negative for back pain.  Skin: Negative for color change.  Neurological: Negative for dizziness and light-headedness.  Psychiatric/Behavioral: Negative for agitation.       Objective:   Physical Exam  Constitutional: She is oriented to person, place, and time. She appears well-developed and well-nourished. No distress.  Cardiovascular: Normal rate and regular rhythm.   Pulmonary/Chest: Effort normal and breath sounds normal. No respiratory distress. She has no wheezes. She has no rales.  Abdominal: Soft. There is no tenderness.  Musculoskeletal: She exhibits no edema or tenderness.  Neurological: She is alert and oriented to person, place, and time. Coordination normal.  Skin: Skin is warm and dry. No rash noted. She is not diaphoretic. No erythema. No pallor.  Psychiatric: She has a normal mood and affect. Her behavior is normal.  Nursing note and vitals reviewed.         Assessment & Plan:

## 2014-11-30 NOTE — Patient Instructions (Signed)
General Instructions: -You may take OTC benadryl or Tylenol PM to help you sleep.  -Start Kegel's exercises for the urinary urgency. If your symptoms do not improve, please make an appointment to start a new medication for this.  -Please mail the stool cards back to the clinic. You need to collect 3 different samples from 3 different days.  -You have been referred for a mammogram.  -Continue taking you diabetes medications and resume your exercise routine.  -Follow up in 6 months.   Thank you for bringing your medicines today. This helps [place] keep you safe from mistakes.   Progress Toward Treatment Goals:  Treatment Goal 06/27/2014  Hemoglobin A1C at goal    Self Care Goals & Plans:  Self Care Goal 11/30/2014  Manage my medications take my medicines as prescribed; bring my medications to every visit; refill my medications on time; follow the sick day instructions if I am sick  Monitor my health keep track of my blood pressure; keep track of my weight; check my feet daily  Eat healthy foods eat more vegetables; eat fruit for snacks and desserts; eat baked foods instead of fried foods; drink diet soda or water instead of juice or soda  Be physically active find an activity I enjoy  Meeting treatment goals -    Home Blood Glucose Monitoring 06/27/2014  Check my blood sugar no home glucose monitoring  When to check my blood sugar -     Care Management & Community Referrals:  Referral 06/27/2014  Referrals made for care management support none needed      Ejercicios de Kegel  (Kegel Exercises) El objetivo de los ejercicios de Kegel es aislar y Company secretary los msculos del suelo plvico. Estos msculos actan como una hamaca que soporta el recto, la vagina, el intestino delgado y Aceitunas. A medida que los msculos se debilitan, se hunden y esos rganos son desplazados de sus posiciones normales. Los ejercicios de Kegel fortalecen los msculos del suelo plvico y ayudan a Careers information officer  control de la vejiga y del intestino, mejoran la respuesta sexual y Biomedical scientist a Lawyer problemas y Chartered certified accountant durante el [place]. Se pueden hacer en cualquier lugar y en cualquier momento.  CMO REALIZAR LOS EJERCICIOS DE KEGEL   Localice los msculos del suelo plvico. Para ello apriete (contraiga) los msculos que se utilizan para tratar de TEFL teacher el flujo de [place]. Usted se sentir una opresin en el rea vaginal (mujeres) y que se eleva y comprime la zona rectal (hombres y mujeres).  Para comenzar, contraiga los msculos plvicos durante 2 a 5 segundos y despus reljelos durante 2 a 5 segundos. Esta es una serie. Repita 4 a 5 series con una breve pausa en el medio.  Contraiga los msculos de la pelvis durante 8 a 10 segundos y despus reljelos durante 8 a 10 segundos. Repita 4 a 5 series. Si no puede contraer los msculos de la pelvis durante 8 a 10 segundos, trate de 5 a 7 segundos y Scientist, research (physical sciences) 8 a 10 segundos. Su objetivo es Education officer, environmental 4 a 5 series de 10 Advice worker. Mantenga el estmago, las nalgas y las piernas relajadas durante los ejercicios. Realice ambas series de contracciones, cortas y largas. Vare las posiciones. Haga las contracciones 3 a 4 veces por Futures trader. Haga las series mientras est:    Acostado en la cama por la [place].  De pie en el almuerzo.  Sentado por las tardes.  Acostado en la cama por  la noche.  Debe hacer 40 a 50 contracciones por da. No realice ms ejercicios de Kegel por da que lo recomendado. El exceso de ejercicio puede causar fatiga muscular. Contine con estos ejercicios durante al menos 15 a 20 semanas o segn las indicaciones de su mdico.  Document Released: 09/15/2012 [facility] Patient Information 2015 [place], [place]. This information is not intended to replace advice given to you by your health care provider. Make sure you discuss any questions you have with your health care provider.  La diabetes mellitus y los  alimentos (Diabetes Mellitus and Food) Es importante que controle su nivel de azcar en la sangre (glucosa). El nivel de glucosa en sangre depende en gran medida de lo que usted come. Comer alimentos saludables en las cantidades [place] a lo largo del Futures trader, aproximadamente a la misma hora [employer], lo ayudar a Chief Operating Officer su nivel de Event organiser. Tambin puede ayudarlo a retrasar o Fish farm manager de la diabetes mellitus. Comer de [employer] saludable incluso puede ayudarlo a Event organiser de presin arterial y a Barista o Pharmacologist un peso saludable.  CMO PUEDEN AFECTARME LOS ALIMENTOS? Carbohidratos Los carbohidratos afectan el nivel de glucosa en sangre ms que cualquier otro tipo de alimento. El nutricionista lo ayudar a Chief Strategy Officer cuntos carbohidratos puede consumir en cada comida y ensearle a contarlos. El recuento de carbohidratos es importante para mantener la glucosa en sangre en un nivel saludable, en especial si utiliza insulina o toma determinados medicamentos para la diabetes mellitus. Alcohol El alcohol puede provocar disminuciones sbitas de la glucosa en sangre (hipoglucemia), en especial si utiliza insulina o toma determinados medicamentos para la diabetes mellitus. La hipoglucemia es una afeccin que puede poner en peligro la vida. Los sntomas de la hipoglucemia (somnolencia, mareos y Administrator) son similares a los sntomas de haber consumido mucho alcohol.  Si el mdico lo autoriza a beber alcohol, hgalo con moderacin y siga estas pautas:  Las mujeres no deben beber ms de un trago por da, y los hombres no deben beber ms de dos tragos por Futures trader. Un trago es igual a:  12 onzas (355 ml) de cerveza  5 onzas de vino (150 ml) de vino  1,5onzas (45ml) de bebidas espirituosas  No beba con el estmago vaco.  Mantngase hidratado. Beba agua, gaseosas dietticas o t helado sin azcar.  Las gaseosas comunes, los jugos y otros refrescos podran  contener muchos carbohidratos y se Heritage manager. QU ALIMENTOS NO SE RECOMIENDAN? Cuando haga las elecciones de alimentos, es importante que recuerde que todos los alimentos son distintos. Algunos tienen menos nutrientes que otros por porcin, aunque podran tener la misma cantidad de caloras o carbohidratos. Es difcil darle al cuerpo lo que necesita cuando consume alimentos con menos nutrientes. Estos son algunos ejemplos de alimentos que debera evitar ya que contienen muchas caloras y carbohidratos, pero pocos nutrientes:  Neurosurgeon trans (la mayora de los alimentos procesados incluyen grasas trans en la etiqueta de Informacin nutricional).  Gaseosas comunes.  Jugos.  Caramelos.  Dulces, como tortas, pasteles, rosquillas y [place].  Comidas fritas. QU ALIMENTOS PUEDO COMER? Consuma alimentos ricos en nutrientes, que nutrirn el cuerpo y lo mantendrn saludable. Los alimentos que debe comer tambin dependern de varios factores, como:  Las caloras que necesita.  Los medicamentos que toma.  Su peso.  El nivel de glucosa en [place].  El [place] de presin arterial.  El nivel de colesterol. Tambin debe consumir una variedad de [place], como:  Protenas, [place],  aves, pescado, tofu, frutos secos y semillas (las protenas de animales magros son mejores).  Nils PyleFrutas.  Verduras.  Productos lcteos, como [place], queso y yogur (descremados son mejores).  Panes, granos, pastas, cereales, arroz y frijoles.  Grasas, como aceite de [place], [place] sin grasas trans, aceite de canola, aguacate y [place] Hillaceitunas. TODOS LOS QUE PADECEN DIABETES MELLITUS TIENEN EL MISMO PLAN DE COMIDAS? Dado que todas las personas que padecen diabetes mellitus son distintas, no hay un solo plan de comidas que funcione para todos. Es muy importante que se rena con un nutricionista que lo ayudar a crear un plan de comidas adecuado para usted. Document Released: 01/06/2008 Document Revised:  10/04/2013 [facility] Patient Information 2015 [place], [place]. This information is not intended to replace advice given to you by your health care provider. Make sure you discuss any questions you have with your health care provider.

## 2014-12-01 DIAGNOSIS — R350 Frequency of micturition: Secondary | ICD-10-CM | POA: Insufficient documentation

## 2014-12-01 LAB — MICROALBUMIN / CREATININE URINE RATIO
Creatinine, Urine: 193.8 mg/dL
MICROALB/CREAT RATIO: 6.2 mg/g (ref 0.0–30.0)
Microalb, Ur: 1.2 mg/dL (ref <2.0)

## 2014-12-01 NOTE — Assessment & Plan Note (Addendum)
She received the flu vaccine during this visit.  She was given stool cards, she prefers this over colonoscopy.  Ordered screening mammogram.

## 2014-12-01 NOTE — Assessment & Plan Note (Addendum)
Lab Results  Component Value Date   HGBA1C 7.2 11/30/2014   HGBA1C 6.9 06/27/2014   HGBA1C 8.4 03/22/2014     Assessment: Diabetes control:  controlled  Progress toward A1C goal:   close to goal Comments: She is on metformin 1000mg  BID ac, glipizide 10mg  BID ac. She does not check her CBG at home. Denies symptoms of hypoglycemia such as tremors, blurry vision, lightheadedness.   Plan: Medications:  continue current medications Home glucose monitoring: Frequency:   Timing:   Instruction/counseling given: reminded to get eye exam, discussed foot care and discussed diet Educational resources provided: brochure, handout Self management tools provided:   Other plans: Performed diabetic foot exam. Checked urine microalbumin/Cr which is normal. HgbA1c only slightly elevated since last time, she is motivated to exercise again and improve her diet. Follow up in 6 months.

## 2014-12-01 NOTE — Assessment & Plan Note (Signed)
She describes symptoms of urinary urgency. Urine dipstick is negative for UTI. Her symptoms are more likely due to overactive bladder/urinary urgency. She prefers Kegel's exercises for now to strength her pelvic floor muscles.  -Provided instructions for Kegel's exercises in Spanish.

## 2014-12-05 ENCOUNTER — Ambulatory Visit (HOSPITAL_COMMUNITY)
Admission: RE | Admit: 2014-12-05 | Discharge: 2014-12-05 | Disposition: A | Discharge status: Home / Self Care | Payer: No Typology Code available for payment source | Source: Ambulatory Visit | Attending: Internal Medicine | Admitting: Internal Medicine

## 2014-12-05 DIAGNOSIS — Z1231 Encounter for screening mammogram for malignant neoplasm of breast: Secondary | ICD-10-CM | POA: Insufficient documentation

## 2014-12-05 DIAGNOSIS — Z0000 Encounter for general adult medical examination without abnormal findings: Secondary | ICD-10-CM

## 2014-12-07 NOTE — Progress Notes (Signed)
Internal Medicine Clinic Attending  Case discussed with Dr. Kennerly soon after the resident saw the patient.  We reviewed the resident's history and exam and pertinent patient test results.  I agree with the assessment, diagnosis, and plan of care documented in the resident's note.  

## 2015-04-03 ENCOUNTER — Other Ambulatory Visit: Payer: Self-pay | Admitting: *Deleted

## 2015-04-03 DIAGNOSIS — E119 Type 2 diabetes mellitus without complications: Secondary | ICD-10-CM

## 2015-04-04 MED ORDER — GLIPIZIDE 10 MG PO TABS: 10.0000 mg | ORAL_TABLET | Freq: Two times a day (BID) | ORAL | Status: DC

## 2015-04-17 ENCOUNTER — Encounter: Payer: Self-pay | Admitting: Internal Medicine

## 2015-04-17 ENCOUNTER — Ambulatory Visit (INDEPENDENT_AMBULATORY_CARE_PROVIDER_SITE_OTHER): Payer: No Typology Code available for payment source | Admitting: Internal Medicine

## 2015-04-17 VITALS — BP 134/73 | HR 82 | Temp 98.1°F | Ht 60.0 in | Wt 184.6 lb

## 2015-04-17 DIAGNOSIS — E11329 Type 2 diabetes mellitus with mild nonproliferative diabetic retinopathy without macular edema: Secondary | ICD-10-CM

## 2015-04-17 DIAGNOSIS — M79605 Pain in left leg: Secondary | ICD-10-CM

## 2015-04-17 DIAGNOSIS — E119 Type 2 diabetes mellitus without complications: Secondary | ICD-10-CM

## 2015-04-17 DIAGNOSIS — M79606 Pain in leg, unspecified: Secondary | ICD-10-CM | POA: Insufficient documentation

## 2015-04-17 LAB — GLUCOSE, CAPILLARY: GLUCOSE-CAPILLARY: 168 mg/dL — AB (ref 65–99)

## 2015-04-17 LAB — HM DIABETES EYE EXAM

## 2015-04-17 LAB — POCT GLYCOSYLATED HEMOGLOBIN (HGB A1C): HEMOGLOBIN A1C: 7.3

## 2015-04-17 NOTE — Assessment & Plan Note (Addendum)
Burning and numbness down leg x 2 weeks.  Pain is present in thigh and lower leg.  There was no injury mechanism. She thinks the left leg looks swollen at times.  No recent travel.  The pain is present in the left leg. The quality of the pain is described as burning. The pain is at a severity of 8/10. The pain is severe. The pain has been constant since onset. Associated symptoms include numbness. Exacerbated by: sitting for a long time; no pain with walking. She has tried nothing for the symptoms.   Her pain is mostly lateral but no sensory deficits to suggest meralgia paresthetic.  No recent immobilization or unilateral exam to suggest DVT.  She has no increase pain with walking and pain worse after sitting so not c/w PAD.  Lumbar imaging last year revealed degenerative changes causing mild impingement however her pain is more lateral not posterior, she says if feel different from prior sciatica on right and she has negative straight leg raise today.  She also describes anterior thigh pain at times but hip exam is normal.  She has tenderness over the greater trochanter c/w bursitis though would not explain burning all the way down the leg.  - hip xray - conservative treatment for bursitis - avoid positions that cause discomfort, Tylenol for 2 weeks and then ibuprofen if Tylenol not working, heat to the affected area - RTC in 2-3 weeks if still having pain to reassess; if still has pain at greater trochanter can try course of NSAIDs at that time or steroid injection

## 2015-04-17 NOTE — Patient Instructions (Addendum)
1. Please try Tylenol and or heating pad to the outer thigh as needed.  If Tylenol is not helping after the first week or 2 you can try ibuprofen.  You can try stretching and continue your normal walking routine but stop to rest when needed.  Avoid positions that aggravate the pain.  I will call you with results of hip xray.  Please return to clinic in 2-3 weeks if symptoms persist.   2. Please take all medications as prescribed.    3. If you have worsening of your symptoms or new symptoms arise, please call the clinic [phone]), or go to the ER immediately if symptoms are severe.    Bursitis en la cadera (Hip Bursitis) Bursitis es la hinchazn y dolor inflamacin  de un saco lleno de lquido (bursa). Este saco cubre y protege la articulacin de la cadera.  CAUSAS  Un traumatismo.  El uso excesivo de Nurse, learning disabilityla articulacin.  Artritis.  Gota.  Una infeccin.  El Pippa Passestiempo fro.  La falta de precalentamiento y acondicionamientos previos a las [address]actividades. La causa es desconocida.  SNTOMAS  Irritacion leve a intensa.[name].  Dolor e hinchazn en la parte superior de la cadera.  Dolor con Conservation officer, historic buildingsel movimiento.  Si la bursa se infecta, puede haber fiebre. Habr enrojecimiento e inflamacin en la zona de la cadera. Los sntomas (problemas) generalmente disminuyen en 3  4 semanas con tratamiento, pero con frecuencia vuelven a aparecer.  TRATAMIENTO Si el tratamiento conservador no hace efecto, el profesional que lo asiste y usted podrn decidir el drenaje de la bursa e Tourist information centre managerinyectar corticosteroides. Este procedimiento acelera el proceso de curacin. Tambin se Coc[place] ([place]) Islandsutiliza con frecuencia como tratamiento inicial de eleccin. INSTRUCCIONES PARA EL CUIDADO DOMICILIARIO  Aplique hielo sobre la zona afectada durante 15 a 20 minutos por hora mientras se encuentre despierto, durante 2 das. Ponga el hielo en una bolsa plstica y coloque una toalla entre la bolsa y la piel.  Permita que la articulacin descanse  todo lo posible, pero realice ejercicios con movimientos de amplitud mxima al menos 4 veces por da. Cuando el dolor disminuya, comience con movimientos normales y Somers Pointlentos, y con las actividades habituales, para prevenir la rigidez de la cadera (dificultad para moverla).  Utilice los medicamentos de venta libre o de prescripcin para Chief Technology Officerel dolor, Environmental health practitionerel malestar o la [place] Grovefiebre, segn se lo indique el profesional que lo asiste.  Utilice muletas para evitar apoyar el peso sobre la articulacin de la cadera, si se lo indican.  Eleve la cadera por encima del nivel del corazn para reducir la hinchazn. Use almohadas para sostener y Financial controlleramortiguar la cadera.  Masajee suavemente para proporcionar alivio y Personnel officerdisminuir la hinchazn. SOLICITE ATENCIN MDICA DE INMEDIATO SI:  El dolor aumenta an Probation officerdurante el tratamiento.  Tiene fiebre.  Siente calor e inflamacin en la bursa afectada.  Tiene otras preguntas o preocupaciones. EST SEGURO QUE:   Comprende las instrucciones para el alta mdica.  Controlar su enfermedad.  Solicitar atencin mdica de inmediato segn las indicaciones. Document Released: 09/29/2005 Document Revised: 12/22/2011 [facility]ExitCare Patient Information 2015 [place]brookExitCare, MarylandLLC. This information is not intended to replace advice given to you by your health care provider. Make sure you discuss any questions you have with your health care provider.

## 2015-04-17 NOTE — Progress Notes (Signed)
Subjective:    Patient ID: [name], female    DOB: 1951/09/22, 64 y.o.   MRN: [id number]  HPI Comments: Burning and numbness down leg.    Leg Pain  The incident occurred more than 1 week ago. There was no injury mechanism. The pain is present in the left leg. The quality of the pain is described as burning. The pain is at a severity of 8/10. The pain is severe. The pain has been constant since onset. Associated symptoms include numbness. Exacerbated by: sitting for a long time; no pain with walking. [name] has tried nothing for the symptoms.     Past Medical History  Diagnosis Date  . DM type 2 (diabetes mellitus, type 2) 2006  . 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 Lamonte Sakai, CT 09/01/2008 - bilateral patchy airspace disease, mild traction bronchiectasis with lower lobes,    hilar adenopathy, mediastinal adenopathy, approximately the same in degree is noted on 03/09/2008 CT  . Hyperlipidemia    Current Outpatient Prescriptions on File Prior to Visit  Medication Sig Dispense Refill  . Blood Glucose Monitoring Suppl (BLOOD GLUCOSE METER KIT AND SUPPLIES) Check blood sugar up to 1 time a day as instructed 1 each 1  . cyclobenzaprine (FLEXERIL) 5 MG tablet Take 1 tablet (5 mg total) by mouth 2 (two) times daily as needed for muscle spasms. 30 tablet 1  . glipiZIDE (GLUCOTROL) 10 MG tablet Take 1 tablet (10 mg total) by mouth 2 (two) times daily before a meal. 180 tablet 3  . glucose blood (WAVESENSE PRESTO) test strip Check blood sugar as instructed up to 1 time a day 50 each 12  . Lancets 30G MISC .check blood sugar up to 1 time a day as instructred 100 each 12  . lovastatin (MEVACOR) 40 MG tablet Take 1 tablet (40 mg total) by mouth at bedtime. 30 tablet 11  . metFORMIN (GLUCOPHAGE) 1000 MG tablet Take 1 tablet (1,000 mg total) by mouth 2 (two) times daily. 60 tablet 11  . ranitidine (ZANTAC) 150 MG tablet Take 1 tablet (150 mg total) by mouth 2 (two) times daily. 60 tablet 12     No current facility-administered medications on file prior to visit.    Review of Systems  Constitutional: Negative for fever, chills and appetite change.  Respiratory: Negative for shortness of breath.   Cardiovascular: Positive for leg swelling. Negative for chest pain.  Gastrointestinal:       No loss of control of bowel or bladder.  Musculoskeletal: Positive for arthralgias.  Neurological: Positive for numbness. Negative for weakness.       Filed Vitals:   04/17/15 0942  BP: 134/73  Pulse: 82  Temp: 98.1 F (36.7 C)  TempSrc: Oral  Height: 5' (1.524 m)  Weight: 184 lb 9.6 oz (83.734 kg)  SpO2: 99%     Objective:   Physical Exam  Constitutional: [name] is oriented to person, place, and time. [name] appears well-developed. No distress.  HENT:  Head: Normocephalic and atraumatic.  Mouth/Throat: Oropharynx is clear and moist. No oropharyngeal exudate.  Eyes: EOM are normal. Pupils are equal, round, and reactive to light.  Neck: Neck supple.  Cardiovascular: Normal rate, regular rhythm, normal heart sounds and intact distal pulses.  Exam reveals no gallop and no friction rub.   No murmur heard. Pulmonary/Chest: Effort normal and breath sounds normal. No respiratory distress. [name] has no wheezes. [name] has no rales.  Abdominal: Soft. Bowel sounds are normal. [name]  exhibits no distension. There is no tenderness. There is no rebound.  Musculoskeletal: Normal range of motion. [name] exhibits tenderness. [name] exhibits no edema.  Normal hip ROM; TTP of left greater trochanter; negative straight leg raise B/L; no gross sensory deficits  Neurological: [name] is alert and oriented to person, place, and time. [name] has normal reflexes. [name] displays normal reflexes. No cranial nerve deficit. [name] exhibits normal muscle tone. Coordination normal.  Skin: Skin is warm and dry. No rash noted. [name] is not diaphoretic. No erythema.  No rash on thighs or legs  Psychiatric: [name] has a normal mood and affect. Her  behavior is normal. Judgment and thought content normal.  Vitals reviewed.         Assessment & Plan:  Please see problem based assessment and plan.

## 2015-04-18 ENCOUNTER — Encounter: Payer: Self-pay | Admitting: *Deleted

## 2015-04-18 NOTE — Progress Notes (Signed)
Internal Medicine Clinic Attending  Case discussed with Dr. Wilson soon after the resident saw the patient.  We reviewed the resident's history and exam and pertinent patient test results.  I agree with the assessment, diagnosis, and plan of care documented in the resident's note.  

## 2015-04-26 ENCOUNTER — Encounter: Payer: Self-pay | Admitting: Pulmonary Disease

## 2015-07-17 ENCOUNTER — Other Ambulatory Visit: Payer: Self-pay | Admitting: Internal Medicine

## 2015-07-26 ENCOUNTER — Encounter: Payer: Self-pay | Admitting: Internal Medicine

## 2015-07-26 ENCOUNTER — Ambulatory Visit (INDEPENDENT_AMBULATORY_CARE_PROVIDER_SITE_OTHER): Payer: Self-pay | Admitting: Internal Medicine

## 2015-07-26 VITALS — BP 118/76 | HR 92 | Temp 98.4°F | Wt 179.0 lb

## 2015-07-26 DIAGNOSIS — K219 Gastro-esophageal reflux disease without esophagitis: Secondary | ICD-10-CM

## 2015-07-26 DIAGNOSIS — E1165 Type 2 diabetes mellitus with hyperglycemia: Secondary | ICD-10-CM

## 2015-07-26 DIAGNOSIS — Z0000 Encounter for general adult medical examination without abnormal findings: Secondary | ICD-10-CM

## 2015-07-26 DIAGNOSIS — Z23 Encounter for immunization: Secondary | ICD-10-CM

## 2015-07-26 DIAGNOSIS — Z7984 Long term (current) use of oral hypoglycemic drugs: Secondary | ICD-10-CM

## 2015-07-26 DIAGNOSIS — E785 Hyperlipidemia, unspecified: Secondary | ICD-10-CM

## 2015-07-26 DIAGNOSIS — E113292 Type 2 diabetes mellitus with mild nonproliferative diabetic retinopathy without macular edema, left eye: Secondary | ICD-10-CM

## 2015-07-26 LAB — GLUCOSE, CAPILLARY: GLUCOSE-CAPILLARY: 185 mg/dL — AB (ref 65–99)

## 2015-07-26 LAB — POCT GLYCOSYLATED HEMOGLOBIN (HGB A1C): Hemoglobin A1C: 7.3

## 2015-07-26 MED ORDER — METFORMIN HCL 1000 MG PO TABS: 1000.0000 mg | ORAL_TABLET | Freq: Two times a day (BID) | ORAL | Status: DC

## 2015-07-26 MED ORDER — RANITIDINE HCL 150 MG PO TABS: 150.0000 mg | ORAL_TABLET | Freq: Two times a day (BID) | ORAL | Status: AC

## 2015-07-26 MED ORDER — LOVASTATIN 40 MG PO TABS: 40.0000 mg | ORAL_TABLET | Freq: Every day | ORAL | Status: DC

## 2015-07-26 MED ORDER — GLIPIZIDE 10 MG PO TABS: 10.0000 mg | ORAL_TABLET | Freq: Two times a day (BID) | ORAL | Status: DC

## 2015-07-26 NOTE — Assessment & Plan Note (Addendum)
-  Flu vaccine given. -Referral made for screening colonoscopy.  Patient has not had colonoscopy to date. -Screening mammogram completed Feb 2016 w/ no mammographic evidence of malignancy.  Recommendation is repeat screening mammogram in 1 year. -Pap screen performed June 2015 and was normal -Consider Prevnar at next office visit with patient hx of diabetes despite age 62<65

## 2015-07-26 NOTE — Patient Instructions (Signed)
Thank you for bringing your medicines today. This helps us keep you safe from mistakes.  We have made the following referrals for you: 1. Gastroenterology for colonoscopy and 2. Diabetes educator for help with diet and nutrition  Recuento bsico de carbohidratos para la diabetes mellitus (Basic Carbohydrate Counting for Diabetes Mellitus) El recuento de carbohidratos es un mtodo destinado a calcular la cantidad de carbohidratos en la dieta. El consumo de carbohidratos aumenta naturalmente el nivel de azcar (glucosa) en la sangre, por lo que es importante que sepa la cantidad que debe incluir en cada comida. El recuento de carbohidratos ayuda a Futures tradermantener el nivel de glucosa en la sangre dentro de los lmites normales. La cantidad permitida de carbohidratos es diferente para cada persona. Un nutricionista puede ayudarlo a calcular la cantidad adecuada para usted. Una vez que sepa la cantidad de carbohidratos que puede consumir, podr calcular los carbohidratos de los alimentos que desea comer. Los siguientes alimentos incluyen carbohidratos:  Granos, como panes y cereales.  Frijoles secos y productos con soja.  Vegetales almidonados, como papas, guisantes y maz.  Nils PyleFrutas y jugos de frutas.  Leche y Dentistyogur.  Dulces y bocadillos, como pastel, galletas, caramelos, papas fritas de bolsa, refrescos y bebidas frutales con azcar. RECUENTO DE CARBOHIDRATOS Toys ''R'' UsHay dos maneras de calcular los carbohidratos de los alimentos. Puede usar cualquiera de 1 Kamani Stlos dos mtodos o [place]a combinacin de [place]ambos. Leer la etiqueta de informacin nutricional de los alimentos envasados La informacin nutricional es una etiqueta incluida en casi todas las bebidas y los alimentos envasados de los [place]ados Un[place]. Indica el tamao de la porcin de ese alimento o bebida e informacin sobre los nutrientes de cada porcin, incluso los gramos (g) de carbohidratos por porcin.  Decida la cantidad de porciones que comer o tomar de este  alimento o bebida. Multiplique la cantidad de porciones por el nmero de gramos de carbohidratos indicados en la etiqueta para esa porcin. El total ser la cantidad de carbohidratos que consumir al comer ese alimento o tomar esa bebida. Conocer las porciones estndar de los alimentos Cuando coma alimentos no envasados o que no incluyan la informacin nutricional en la etiqueta, deber medir las porciones para poder calcular la cantidad de carbohidratos. Una porcin de la mayora de los alimentos ricos en carbohidratos contiene alrededor de 15g de carbohidratos. La siguiente World Fuel Services Corporationlista incluye los tamaos de porcin de los alimentos ricos en carbohidratos que contienen alrededor de 15g de carbohidratos por porcin:   1rebanada de pan (1oz) o 1tortilla de seis pulgadas.  panecillo de hamburguesa o bollito tipo ingls.  4a 6galletas.   de taza de cereal sin azcar y seco.   taza de cereal caliente.   de taza de arroz o pastas.  taza de pur de papas o de una papa grande al horno.  1taza de frutas frescas o una fruta pequea.  taza de frutas o jugo de frutas enlatados o congelados.  1 taza AutoZonede leche.   de taza de yogur descremado sin ningn agregado o de yogur endulzado con edulcorante artificial.  taza de vegetales almidonados, como guisantes, maz o papas, o de frijoles secos cocidos. Decida la cantidad de porciones Advertising copywriterestndar que comer. Multiplique la cantidad de porciones por 15 (los gramos de carbohidratos en esa porcin). Por ejemplo, si come 2tazas de fresas, habr comido 2porciones y 30g de carbohidratos (2porciones x 15g = 30g). Para las comidas como sopas y guisos, en las que se mezcla ms de un alimento, deber [place] Northern Santa Fecontar los carbohidratos de cada  alimento incluido. EJEMPLO DE RECUENTO DE CARBOHIDRATOS Ejemplo de cena  3 onzas de pechugas de pollo.   de taza de arroz integral.   taza de maz.  1 taza de [place].  1 taza de fresas con crema batida sin  azcar. Clculo de carbohidratos [place] 1: Identifique los alimentos que contienen carbohidratos:   Arroz.  Maz.  Leche.  Jinny Sanders. [place] 2: Calcule el nmero de porciones que consumir de cada uno:   2 porciones de Surveyor, minerals.  1 porcin de maz.  1 porcin de leche.  1 porcin de fresas. [place] 3: Multiplique cada una de esas porciones por 15g:   2 porciones de arroz x 15 g = 30 g.  1 porcin de maz x 15 g = 15 g.  1 porcin de leche x 15 g = 15 g.  1 porcin de fresas x 15 g = 15 g. [place] 4: Sume todas las cantidades para Artist total de gramos de carbohidratos consumidos: 30 g + 15 g + 15 g + 15 g = 75 g.   Esta informacin no tiene Theme park manager el consejo del mdico. Asegrese de hacerle al mdico cualquier pregunta que tenga.   Document Released: 12/22/2011 Document Revised: 10/20/2014 Elsevier Interactive Patient Education [employer]

## 2015-07-26 NOTE — Assessment & Plan Note (Signed)
Assessment:  Patient with Hgb A1c of 7.3 on 04/17/2015 presents today with CBG of 185.  Patient reports good compliance with medications.  Denies any recent symptomatic hypoglycemia or hyperglycemia.  Diabetes control: controlled, close to goal   Progress toward Hgb A1c goal: unchanged Home glucose monitoring: None.  Patient has glucometer on med list but states that she does not have one at home.  She would like to check sugars at home daily.  Patient does not have insurance so recommending [facility] Prime Meter  Frequency: daily  Timing: before meals  Plan:  -Medication changes: none  -Hgb A1c 7.3 today.  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  Needs follow up photos in 6 months from July. -Last annual foot exam: done today -Last annual urine microalbumin/creatine ratio: 6.2 in Feb 2016.  Repeat in Feb 2017 -Encourage regular exercise and healthy diet.  Patient states walks every 3rd day.  Recommended walking more -Other plans: referral to diabetes educator  Medications after today's visit: 1. Metformin 1000mg  BID 2. Glipizide 10mg  BID AC

## 2015-07-26 NOTE — Progress Notes (Signed)
Patient ID: Guadalupe Kerekes, female   DOB: 22-Jun-1951, 64 y.o.   MRN: [id number]   Subjective:   Patient ID: Dmya Long female   DOB: [birth year] 64 y.o.   MRN: [id number]  HPI: Ms.Merry Henrietta Dine is a 64 y.o. female with past medical hx of DM type 2, HLD, GERD who presents today for follow up of her diabetes and HLD, needing refills of metformin, glipizide, and lovastatin.  She has no other complaints today but states that she would like to have a glucometer to check her blood sugars at home. She is also complaining of reflux-type symptoms associated with eating.  She has used Zantac in the past and states this helped.    Past Medical History  Diagnosis Date  . DM type 2 (diabetes mellitus, type 2) ([place]) 2006  . Sarcoidosis of lung ([place]) 04/27/08    Followed by Dr. Lamonte Sakai, CT 09/01/2008 - bilateral patchy airspace disease, mild traction bronchiectasis with lower lobes,    hilar adenopathy, mediastinal adenopathy, approximately the same in degree is noted on 03/09/2008 CT  . Hyperlipidemia    Current Outpatient Prescriptions  Medication Sig Dispense Refill  . glipiZIDE (GLUCOTROL) 10 MG tablet Take 1 tablet (10 mg total) by mouth 2 (two) times daily before a meal. 180 tablet 3  . lovastatin (MEVACOR) 40 MG tablet Take 1 tablet (40 mg total) by mouth at bedtime. 30 tablet 11  . metFORMIN (GLUCOPHAGE) 1000 MG tablet TAKE ONE TABLET BY MOUTH TWICE DAILY 60 tablet 3  . Blood Glucose Monitoring Suppl (BLOOD GLUCOSE METER KIT AND SUPPLIES) Check blood sugar up to 1 time a day as instructed (Patient not taking: Reported on 07/26/2015) 1 each 1  . glucose blood (WAVESENSE PRESTO) test strip Check blood sugar as instructed up to 1 time a day (Patient not taking: Reported on 07/26/2015) 50 each 12  . Lancets 30G MISC .check blood sugar up to 1 time a day as instructred (Patient not taking: Reported on 07/26/2015) 100 each 12   No current facility-administered medications for this visit.   Family  History  Problem Relation Age of Onset  . Hypertension Mother   . Kidney disease Father   . Breast cancer Maternal Grandfather   . Asthma Paternal Grandfather   . Cancer Maternal Grandmother   . Breast cancer Maternal Grandmother    Social History   Social History  . Marital Status: Married    Spouse Name: N/A  . Number of Children: N/A  . Years of Education: N/A   Social History Main Topics  . Smoking status: Former Smoker -- 0.50 packs/day for 10 years    Types: Cigarettes    Quit date: 02/19/1989  . Smokeless tobacco: Never Used  . Alcohol Use: 0.0 oz/week    0 Standard drinks or equivalent per week     Comment: occassional  . Drug Use: No  . Sexual Activity: Not Asked     Comment: Only sexual partner is husband > 46yr   Other Topics Concern  . None   Social History Narrative   [facility] per DDillard's  09/20/2010.   Patient lives with husband and son in an apartment and has lived there for 3 years, patient has occasional contact with grandchildren ages 5106and 951 She owns no pets or animals. [place]. There is no known exposure to TB      5 children,  3 are in [place]; financial income from son   Review of Systems: Review of Systems  Constitutional: Negative for fever and chills.  Eyes: Negative for blurred vision.  Respiratory: Negative for cough.   Cardiovascular: Negative for chest pain.  Gastrointestinal: Positive for heartburn. Negative for nausea and vomiting.  Genitourinary: Negative for dysuria.  Musculoskeletal: Negative for neck pain.  Skin: Negative for rash.  Neurological: Negative for dizziness and headaches.  Psychiatric/Behavioral: Negative for depression.    Objective:  Physical Exam: Filed Vitals:   07/26/15 1004  BP: 118/76  Pulse: 92  Temp: 98.4 F (36.9 C)  TempSrc: Oral  Weight: 179 lb (81.194 kg)  SpO2: 98%   Physical Exam    Constitutional: She is oriented to person, place, and time. She appears well-developed and well-nourished.  HENT:  Head: Normocephalic and atraumatic.  Eyes: Conjunctivae and EOM are normal.  Neck: Normal range of motion.  Cardiovascular: Normal rate and regular rhythm.   Respiratory: Effort normal and breath sounds normal.  GI: Soft. Bowel sounds are normal.  Mild epigastric tenderness  Musculoskeletal: Normal range of motion. She exhibits no edema.  Neurological: She is alert and oriented to person, place, and time.  Skin: Skin is warm and dry.  Psychiatric: She has a normal mood and affect.    Assessment & Plan:   Please see Problem List for Assessment and Plan

## 2015-07-26 NOTE — Assessment & Plan Note (Signed)
Lipid Panel:     Component Value Date/Time   CHOL 304* 07/27/2013 1657   TRIG 373* 07/27/2013 1657   HDL 51 07/27/2013 1657   CHOLHDL 6.0 07/27/2013 1657   VLDL 75* 07/27/2013 1657   LDLCALC 178* 07/27/2013 1657   Assessment:  Patient with hyperlipidemia compliant with Lovastatin.  Last lipid panel was 07/27/2013 with TC 304, TG 373, HDL 51, LDL 178.  10-Year ASCVD Calculated Risk is 9.1.   Goal LDL: <100 Comments: patient previously did not tolerate pravastatin due to myalgia.    Plan:   -Medications: Lovastatin 40mg  QHS -Labs: none today.  Can consider rechecking lipid profile at next follow up w/ PCP.  Unsure of what management change would make based on those results as patient previously unable to tolerate Pravastatin, unsure of ability to afford rosuvastatin or atorvastatin.  Lovastatin remains on [facility] $4 list. -Encourage lifestyle modifications to lower cardiovascular disease risk.  This includes eating a heart-healthy diet, regular aerobic exercise, maintenance of desirable body weight, and avoidance of tobacco products

## 2015-07-26 NOTE — Assessment & Plan Note (Signed)
Assessment: Patient complains of mild epigastric tenderness and heartburn associated with eating.  Has not been taking any antacids but previously been on Zantac.  Plan: -Zantac 150mg  BID -encourage to avoid foods that are known triggers for her heartburn

## 2015-07-30 NOTE — Progress Notes (Signed)
Internal Medicine Clinic Attending  I saw and evaluated the patient.  I personally confirmed the key portions of the history and exam documented by Dr. Wallace and I reviewed pertinent patient test results.  The assessment, diagnosis, and plan were formulated together and I agree with the documentation in the resident's note. 

## 2015-08-02 ENCOUNTER — Ambulatory Visit: Payer: Self-pay

## 2015-08-07 NOTE — Addendum Note (Signed)
Addended by: Neomia DearPOWERS, Lillyn Wieczorek E on: 08/07/2015 09:17 AM   Modules accepted: Orders

## 2015-08-22 ENCOUNTER — Encounter: Payer: Self-pay | Admitting: Dietician

## 2015-08-22 ENCOUNTER — Ambulatory Visit: Payer: Self-pay | Admitting: Dietician

## 2015-09-24 NOTE — Addendum Note (Signed)
Addended by: Neomia DearPOWERS, Duc Crocket E on: 09/24/2015 09:37 PM   Modules accepted: Orders

## 2016-06-30 ENCOUNTER — Ambulatory Visit (INDEPENDENT_AMBULATORY_CARE_PROVIDER_SITE_OTHER): Payer: Self-pay | Admitting: Internal Medicine

## 2016-06-30 ENCOUNTER — Ambulatory Visit: Payer: Self-pay

## 2016-06-30 VITALS — BP 129/76 | HR 77 | Temp 97.5°F | Ht 61.0 in | Wt 183.5 lb

## 2016-06-30 DIAGNOSIS — E113299 Type 2 diabetes mellitus with mild nonproliferative diabetic retinopathy without macular edema, unspecified eye: Secondary | ICD-10-CM

## 2016-06-30 DIAGNOSIS — Z7984 Long term (current) use of oral hypoglycemic drugs: Secondary | ICD-10-CM

## 2016-06-30 DIAGNOSIS — E113292 Type 2 diabetes mellitus with mild nonproliferative diabetic retinopathy without macular edema, left eye: Secondary | ICD-10-CM

## 2016-06-30 DIAGNOSIS — I951 Orthostatic hypotension: Secondary | ICD-10-CM | POA: Insufficient documentation

## 2016-06-30 DIAGNOSIS — Z0000 Encounter for general adult medical examination without abnormal findings: Secondary | ICD-10-CM

## 2016-06-30 DIAGNOSIS — E1165 Type 2 diabetes mellitus with hyperglycemia: Secondary | ICD-10-CM

## 2016-06-30 DIAGNOSIS — Z23 Encounter for immunization: Secondary | ICD-10-CM

## 2016-06-30 LAB — GLUCOSE, CAPILLARY: Glucose-Capillary: 178 mg/dL — ABNORMAL HIGH (ref 65–99)

## 2016-06-30 LAB — POCT GLYCOSYLATED HEMOGLOBIN (HGB A1C): HEMOGLOBIN A1C: 8.5

## 2016-06-30 NOTE — Assessment & Plan Note (Addendum)
One month of persistent dizziness with standing or sitting up. Positive orthostatics in clinic, systolics remained stable in the mid 100s to 110, heart rate stable around 80, diastolic drops from 70s to mid 50s after 3 minutes standing. Patient endorses increased polyuria in the past month. No diet or medication changes, does not take any antihypertensives, likely decreased diabetes control as A1c is 8.5 today from 7.3 last year. She eats 3 meals a day but does not drink much water. She denies worsening of her dizziness with head movements, no sensation consistent with the room spinning, Dix-Hallpike negative on exam, and dizziness was elicited upon sitting up and when standing.   Plan: Encouraged diet modification, less sweets and carbs Encouraged increased fluid intake, more water Encouraged to take her time sitting up and standing Scheduled diabetes education appointment with Lupita Leashonna at next PCP visit Will need lipid panel and UA at next visit

## 2016-06-30 NOTE — Progress Notes (Signed)
   CC: Dizziness  HPI:  [name] is a 65 y.o. female with PMHx detailed below presenting with 1 month of persistent dizziness with standing.  History and encounter assisted by accompanying daughter, provided Spanish-English translation when needed.  See problem based assessment and plan below for additional details.  Past Medical History:  Diagnosis Date  . DM type 2 (diabetes mellitus, type 2) (HCC) 2006  . Hyperlipidemia   . Sarcoidosis of lung (HCC) 04/27/08   Followed by Dr. Delton CoombesByrum, CT 09/01/2008 - bilateral patchy airspace disease, mild traction bronchiectasis with lower lobes,    hilar adenopathy, mediastinal adenopathy, approximately the same in degree is noted on 03/09/2008 CT    Review of Systems: Review of Systems  Constitutional: Negative for fever, malaise/fatigue and weight loss.  Eyes: Negative for blurred vision.  Respiratory: Negative for shortness of breath.   Cardiovascular: Negative for chest pain.  Gastrointestinal: Negative for abdominal pain, diarrhea, nausea and vomiting.  Genitourinary: Positive for frequency. Negative for dysuria, hematuria and urgency.  Neurological: Positive for dizziness. Negative for sensory change and headaches.    Physical Exam: Vitals:   06/30/16 1046  BP: 129/76  Pulse: 77  Temp: 97.5 F (36.4 C)  TempSrc: Oral  SpO2: 99%  Weight: 183 lb 8 oz (83.2 kg)  Height: 5\' 1"  (1.549 m)   Body mass index is 34.67 kg/m. GENERAL- Overweight woman sitting comfortably in exam room chair, alert, in no distress HEENT- Atraumatic, PERRL, EOMI, moist mucous membranes, no nystagmus present, dix hallpike maneuver negative CARDIAC- Regular rate and rhythm, no murmurs, rubs or gallops. RESP- Clear to ascultation bilaterally, no wheezing or crackles, normal work of breathing ABDOMEN- Normoactive bowel sounds, soft, mild epigastric tenderness to palpation, nondistended EXTREMITIES- Normal bulk and range of motion, no edema, 2+  peripheral pulses SKIN- Warm, dry, intact, without visible rash PSYCH- Appropriate affect, clear speech, thoughts linear and goal-directed  Assessment & Plan:   See encounters tab for problem based medical decision making.  Patient seen with Dr. Cleda DaubE. Hoffman

## 2016-06-30 NOTE — Assessment & Plan Note (Signed)
Provided flu vaccine 

## 2016-06-30 NOTE — Assessment & Plan Note (Addendum)
Loss of glycemic control since 1 year ago. HbA1c 8.5 compared to 7.3 last year, endorses polyuria recently but no polydipsia or blurry vision. States blood sugars running in 170s/180s recently but stated that she is checking post-prandially, and only recently began checking her sugars at home a few days ago. She is taking Metformin 1000mg  BID and Glipizide 10mg  BID.  Plan: - Encouraged to check blood sugars at least daily and bring in log at follow up appointment - Encouraged diet modification and exercise - May require increased glipizide dose or an additional agent - Follow up next visit

## 2016-06-30 NOTE — Patient Instructions (Addendum)
Thank you for visiting clinic today!  We think your dizziness is from low blood pressure.  Please try and drink more water and fluids to stay well-hydrated. Please minimize the number of sugary foods you eat, as well as the amount of carbs. The vitamins you take may be too strong to take every day, try taking them every other day or every three days. Taking too much vitamins will make you pee too often, which could cause you to become dehydrated.  Please continue to take her medications as prescribed.

## 2016-07-02 NOTE — Progress Notes (Signed)
Internal Medicine Clinic Attending  I saw and evaluated the patient.  I personally confirmed the key portions of the history and exam documented by Dr. Johnson and I reviewed pertinent patient test results.  The assessment, diagnosis, and plan were formulated together and I agree with the documentation in the resident's note.  

## 2016-07-03 ENCOUNTER — Ambulatory Visit: Payer: Self-pay

## 2016-07-17 ENCOUNTER — Encounter: Payer: Self-pay | Admitting: Pulmonary Disease

## 2016-07-30 ENCOUNTER — Telehealth: Payer: Self-pay | Admitting: Pulmonary Disease

## 2016-07-30 NOTE — Telephone Encounter (Signed)
APT. REMINDER CALL, LMTCB °

## 2016-07-31 ENCOUNTER — Ambulatory Visit (INDEPENDENT_AMBULATORY_CARE_PROVIDER_SITE_OTHER): Payer: Self-pay | Admitting: Pulmonary Disease

## 2016-07-31 ENCOUNTER — Other Ambulatory Visit (HOSPITAL_COMMUNITY): Payer: Self-pay

## 2016-07-31 ENCOUNTER — Ambulatory Visit (HOSPITAL_COMMUNITY)
Admission: RE | Admit: 2016-07-31 | Discharge: 2016-07-31 | Disposition: A | Discharge status: Home / Self Care | Payer: Self-pay | Source: Ambulatory Visit | Attending: Family Medicine | Admitting: Family Medicine

## 2016-07-31 ENCOUNTER — Encounter: Payer: Self-pay | Admitting: Pulmonary Disease

## 2016-07-31 VITALS — BP 121/77 | HR 92 | Temp 97.7°F | Ht 61.0 in | Wt 179.4 lb

## 2016-07-31 DIAGNOSIS — Z0000 Encounter for general adult medical examination without abnormal findings: Secondary | ICD-10-CM

## 2016-07-31 DIAGNOSIS — I951 Orthostatic hypotension: Secondary | ICD-10-CM

## 2016-07-31 DIAGNOSIS — D869 Sarcoidosis, unspecified: Secondary | ICD-10-CM

## 2016-07-31 DIAGNOSIS — Z1239 Encounter for other screening for malignant neoplasm of breast: Secondary | ICD-10-CM

## 2016-07-31 DIAGNOSIS — Z1211 Encounter for screening for malignant neoplasm of colon: Secondary | ICD-10-CM

## 2016-07-31 DIAGNOSIS — R9431 Abnormal electrocardiogram [ECG] [EKG]: Secondary | ICD-10-CM | POA: Insufficient documentation

## 2016-07-31 DIAGNOSIS — E113292 Type 2 diabetes mellitus with mild nonproliferative diabetic retinopathy without macular edema, left eye: Secondary | ICD-10-CM

## 2016-07-31 DIAGNOSIS — D860 Sarcoidosis of lung: Secondary | ICD-10-CM

## 2016-07-31 DIAGNOSIS — Z7984 Long term (current) use of oral hypoglycemic drugs: Secondary | ICD-10-CM

## 2016-07-31 DIAGNOSIS — E113299 Type 2 diabetes mellitus with mild nonproliferative diabetic retinopathy without macular edema, unspecified eye: Secondary | ICD-10-CM

## 2016-07-31 DIAGNOSIS — Z8679 Personal history of other diseases of the circulatory system: Secondary | ICD-10-CM

## 2016-07-31 DIAGNOSIS — E1165 Type 2 diabetes mellitus with hyperglycemia: Secondary | ICD-10-CM

## 2016-07-31 LAB — GLUCOSE, CAPILLARY: GLUCOSE-CAPILLARY: 147 mg/dL — AB (ref 65–99)

## 2016-07-31 NOTE — Progress Notes (Signed)
   CC: diabetes  HPI:  [name] is a 65 year old woman with history of DM2, HLD, sarcoidosis presenting for follow up of her diabetes.  A month ago, she was having dizziness. The dizziness has resolved. Polyuria/polydipsia resolved. She checks her blood sugars in the morning and at bedtime. For 2-3 days, it was in the 200s in the mornings. In the evenings it is 140-150. The lowest she has measured was 132. She takes metformin twice a day and glipizide twice a day. She denies missing any doses.    Past Medical History:  Diagnosis Date  . DM type 2 (diabetes mellitus, type 2) (HCC) 2006  . Hyperlipidemia   . Sarcoidosis of lung (HCC) 04/27/08   Followed by Dr. Delton CoombesByrum, CT 09/01/2008 - bilateral patchy airspace disease, mild traction bronchiectasis with lower lobes,    hilar adenopathy, mediastinal adenopathy, approximately the same in degree is noted on 03/09/2008 CT    Review of Systems:   No fever, fatigue, weight loss, visual disturbance, dyspnea, cough, abdominal pain, numbness, tingling, lightheadedness, syncope.   Occasional palpitations.   Physical Exam:  Vitals:   07/31/16 1319  BP: 121/77  Pulse: 92  Temp: 97.7 F (36.5 C)  TempSrc: Oral  SpO2: 99%  Weight: 179 lb 6.4 oz (81.4 kg)  Height: 5\' 1"  (1.549 m)   General Apperance: NAD HEENT: Normocephalic, atraumatic, anicteric sclera Neck: Supple, trachea midline. No lymphadenopathy. Lungs: Clear to auscultation bilaterally. No wheezes, rhonchi or rales. Breathing comfortably Heart: Regular rate and rhythm, no murmur/rub/gallop Abdomen: Soft, nontender, nondistended, no rebound/guarding Extremities: Warm and well perfused, no edema Skin: No rashes or lesions Neurologic: Alert and interactive. No gross deficits.  Independent review of EKG with normal sinus rhythm, left axis deviation, early repolarization in anterior leads, unchanged from previous ekg.   Assessment & Plan:   See Encounters Tab for problem  based charting.  Patient discussed with Dr. Oswaldo DoneVincent

## 2016-07-31 NOTE — Assessment & Plan Note (Signed)
Stool cards for colon cancer screening Mammogram for breast cancer screening

## 2016-07-31 NOTE — Progress Notes (Signed)
Pt does not have insurance - [facility] [phone]).

## 2016-07-31 NOTE — Assessment & Plan Note (Signed)
Diagnosed in 2009 and was previously followed by Dr. Delton CoombesByrum. Inactive for a few years now. Will do some routine monitoring with CBC, CMP, vitamin D level, spirometry and diffusing capacity. EKG with no changes from previous EKG.

## 2016-07-31 NOTE — Patient Instructions (Signed)
Please follow up in 6-8 weeks.

## 2016-07-31 NOTE — Assessment & Plan Note (Signed)
She reports resolution in her symptoms.

## 2016-07-31 NOTE — Assessment & Plan Note (Addendum)
Assessment: She is taking metformin and glipizide twice a day. She denies missing any doses. Her bedtime blood sugars are 140-150 per her report. No hypoglycemic episodes.  Plan: Recheck Hgb A1c in 2 months. Foot exam done Eye exam due Lipid panel Urine microalbumin

## 2016-08-01 LAB — CBC WITH DIFFERENTIAL/PLATELET
BASOS: 1 %
Basophils Absolute: 0.1 10*3/uL (ref 0.0–0.2)
EOS (ABSOLUTE): 0.2 10*3/uL (ref 0.0–0.4)
EOS: 4 %
HEMATOCRIT: 43.5 % (ref 34.0–46.6)
HEMOGLOBIN: 14.5 g/dL (ref 11.1–15.9)
Immature Grans (Abs): 0 10*3/uL (ref 0.0–0.1)
Immature Granulocytes: 0 %
LYMPHS ABS: 1 10*3/uL (ref 0.7–3.1)
Lymphs: 23 %
MCH: 27.3 pg (ref 26.6–33.0)
MCHC: 33.3 g/dL (ref 31.5–35.7)
MCV: 82 fL (ref 79–97)
MONOCYTES: 10 %
MONOS ABS: 0.4 10*3/uL (ref 0.1–0.9)
NEUTROS ABS: 2.7 10*3/uL (ref 1.4–7.0)
Neutrophils: 62 %
Platelets: 249 10*3/uL (ref 150–379)
RBC: 5.31 x10E6/uL — AB (ref 3.77–5.28)
RDW: 13.6 % (ref 12.3–15.4)
WBC: 4.3 10*3/uL (ref 3.4–10.8)

## 2016-08-01 LAB — CMP14 + ANION GAP
ALBUMIN: 4.6 g/dL (ref 3.6–4.8)
ALT: 25 IU/L (ref 0–32)
AST: 30 IU/L (ref 0–40)
Albumin/Globulin Ratio: 1.6 (ref 1.2–2.2)
Alkaline Phosphatase: 48 IU/L (ref 39–117)
Anion Gap: 19 mmol/L — ABNORMAL HIGH (ref 10.0–18.0)
BILIRUBIN TOTAL: 0.7 mg/dL (ref 0.0–1.2)
BUN / CREAT RATIO: 19 (ref 12–28)
BUN: 14 mg/dL (ref 8–27)
CO2: 24 mmol/L (ref 18–29)
CREATININE: 0.74 mg/dL (ref 0.57–1.00)
Calcium: 9.5 mg/dL (ref 8.7–10.3)
Chloride: 98 mmol/L (ref 96–106)
GFR calc non Af Amer: 86 mL/min/{1.73_m2} (ref >59)
GFR, EST AFRICAN AMERICAN: 100 mL/min/{1.73_m2} (ref >59)
GLOBULIN, TOTAL: 2.8 g/dL (ref 1.5–4.5)
Glucose: 134 mg/dL — ABNORMAL HIGH (ref 65–99)
Potassium: 4.2 mmol/L (ref 3.5–5.2)
SODIUM: 141 mmol/L (ref 134–144)
TOTAL PROTEIN: 7.4 g/dL (ref 6.0–8.5)

## 2016-08-01 LAB — LIPID PANEL
CHOL/HDL RATIO: 5.6 ratio — AB (ref 0.0–4.4)
Cholesterol, Total: 278 mg/dL — ABNORMAL HIGH (ref 100–199)
HDL: 50 mg/dL (ref >39)
LDL CALC: 199 mg/dL — AB (ref 0–99)
Triglycerides: 144 mg/dL (ref 0–149)
VLDL Cholesterol Cal: 29 mg/dL (ref 5–40)

## 2016-08-01 LAB — VITAMIN D 25 HYDROXY (VIT D DEFICIENCY, FRACTURES): Vit D, 25-Hydroxy: 22.7 ng/mL — ABNORMAL LOW (ref 30.0–100.0)

## 2016-08-01 LAB — MICROALBUMIN / CREATININE URINE RATIO
CREATININE, UR: 198.4 mg/dL
Microalb/Creat Ratio: 10.2 mg/g creat (ref 0.0–30.0)
Microalbumin, Urine: 20.2 ug/mL

## 2016-08-01 NOTE — Progress Notes (Signed)
Internal Medicine Clinic Attending  Case discussed with Dr. Krall at the time of the visit.  We reviewed the resident's history and exam and pertinent patient test results.  I agree with the assessment, diagnosis, and plan of care documented in the resident's note.  

## 2016-08-08 ENCOUNTER — Ambulatory Visit (HOSPITAL_COMMUNITY)
Admission: RE | Admit: 2016-08-08 | Discharge: 2016-08-08 | Disposition: A | Discharge status: Home / Self Care | Payer: Self-pay | Source: Ambulatory Visit | Attending: Student in an Organized Health Care Education/Training Program | Admitting: Student in an Organized Health Care Education/Training Program

## 2016-08-08 DIAGNOSIS — D869 Sarcoidosis, unspecified: Secondary | ICD-10-CM | POA: Insufficient documentation

## 2016-08-08 LAB — PULMONARY FUNCTION TEST
DL/VA % pred: 121 %
DL/VA: 5.34 ml/min/mmHg/L
DLCO UNC % PRED: 89 %
DLCO unc: 18.1 ml/min/mmHg
FEF 25-75 PRE: 3.54 L/s
FEF 25-75 Post: 1.53 L/sec
FEF2575-%CHANGE-POST: -56 %
FEF2575-%PRED-POST: 75 %
FEF2575-%PRED-PRE: 175 %
FEV1-%Change-Post: -7 %
FEV1-%PRED-POST: 81 %
FEV1-%Pred-Pre: 88 %
FEV1-Post: 1.78 L
FEV1-Pre: 1.94 L
FEV1FVC-%CHANGE-POST: -15 %
FEV1FVC-%PRED-PRE: 119 %
FEV6-%CHANGE-POST: 18 %
FEV6-%Pred-Post: 84 %
FEV6-%Pred-Pre: 70 %
FEV6-Post: 2.3 L
FEV6-Pre: 1.94 L
FEV6FVC-%Change-Post: 0 %
FEV6FVC-%Pred-Post: 103 %
FEV6FVC-%Pred-Pre: 104 %
FVC-%CHANGE-POST: 9 %
FVC-%Pred-Post: 81 %
FVC-%Pred-Pre: 74 %
FVC-Post: 2.31 L
FVC-Pre: 2.11 L
POST FEV1/FVC RATIO: 77 %
POST FEV6/FVC RATIO: 99 %
PRE FEV1/FVC RATIO: 92 %
Pre FEV6/FVC Ratio: 100 %
RV % pred: 73 %
RV: 1.39 L
TLC % pred: 84 %
TLC: 3.88 L

## 2016-08-08 MED ORDER — ALBUTEROL SULFATE (2.5 MG/3ML) 0.083% IN NEBU
2.5000 mg | INHALATION_SOLUTION | Freq: Once | RESPIRATORY_TRACT | Status: AC
  Administered 2016-08-08: 2.5 mg via RESPIRATORY_TRACT

## 2016-08-13 ENCOUNTER — Telehealth: Payer: Self-pay | Admitting: *Deleted

## 2016-08-13 ENCOUNTER — Encounter: Payer: Self-pay | Admitting: Pulmonary Disease

## 2016-08-13 NOTE — Telephone Encounter (Signed)
Called pt's home telephoone # - no one answer; unable to leave message, voice mail has not been set up. Called cell #, which is also son's # - no one answer; left message pt's lung function test is normal per Dr Isabella BowensKrall. And to call for any questions.

## 2016-08-13 NOTE — Telephone Encounter (Signed)
-----   Message from Lora PaulaJennifer T Krall, MD sent at 08/13/2016  9:28 AM EDT ----- Lung function normal. Could you please let her know? Thanks!

## 2016-08-20 ENCOUNTER — Other Ambulatory Visit: Payer: Self-pay | Admitting: Internal Medicine

## 2016-08-20 DIAGNOSIS — E113292 Type 2 diabetes mellitus with mild nonproliferative diabetic retinopathy without macular edema, left eye: Secondary | ICD-10-CM

## 2016-09-24 ENCOUNTER — Other Ambulatory Visit: Payer: Self-pay | Admitting: Internal Medicine

## 2016-09-24 DIAGNOSIS — E113292 Type 2 diabetes mellitus with mild nonproliferative diabetic retinopathy without macular edema, left eye: Secondary | ICD-10-CM

## 2016-09-24 NOTE — Telephone Encounter (Signed)
appt tomorrow with pcp

## 2016-09-25 ENCOUNTER — Encounter: Payer: Self-pay | Admitting: Pulmonary Disease

## 2016-09-25 ENCOUNTER — Ambulatory Visit (INDEPENDENT_AMBULATORY_CARE_PROVIDER_SITE_OTHER): Payer: Self-pay | Admitting: Pulmonary Disease

## 2016-09-25 VITALS — BP 122/73 | HR 78 | Temp 97.9°F | Wt 180.1 lb

## 2016-09-25 DIAGNOSIS — E559 Vitamin D deficiency, unspecified: Secondary | ICD-10-CM

## 2016-09-25 DIAGNOSIS — E113292 Type 2 diabetes mellitus with mild nonproliferative diabetic retinopathy without macular edema, left eye: Secondary | ICD-10-CM

## 2016-09-25 DIAGNOSIS — E119 Type 2 diabetes mellitus without complications: Secondary | ICD-10-CM

## 2016-09-25 DIAGNOSIS — E113299 Type 2 diabetes mellitus with mild nonproliferative diabetic retinopathy without macular edema, unspecified eye: Secondary | ICD-10-CM

## 2016-09-25 DIAGNOSIS — Z7984 Long term (current) use of oral hypoglycemic drugs: Secondary | ICD-10-CM

## 2016-09-25 DIAGNOSIS — E785 Hyperlipidemia, unspecified: Secondary | ICD-10-CM

## 2016-09-25 DIAGNOSIS — Z79899 Other long term (current) drug therapy: Secondary | ICD-10-CM

## 2016-09-25 DIAGNOSIS — E7800 Pure hypercholesterolemia, unspecified: Secondary | ICD-10-CM

## 2016-09-25 LAB — GLUCOSE, CAPILLARY: Glucose-Capillary: 78 mg/dL (ref 65–99)

## 2016-09-25 LAB — HM DIABETES EYE EXAM

## 2016-09-25 LAB — POCT GLYCOSYLATED HEMOGLOBIN (HGB A1C): HEMOGLOBIN A1C: 7.7

## 2016-09-25 MED ORDER — GLIPIZIDE 10 MG PO TABS: 10.0000 mg | ORAL_TABLET | Freq: Two times a day (BID) | ORAL | 3 refills | Status: DC

## 2016-09-25 MED ORDER — LOVASTATIN 40 MG PO TABS: 40.0000 mg | ORAL_TABLET | Freq: Every day | ORAL | 3 refills | Status: DC

## 2016-09-25 MED ORDER — VITAMIN D3 10 MCG (400 UNIT) PO TABS: 800.0000 [IU] | ORAL_TABLET | Freq: Every day | ORAL | 3 refills | Status: DC

## 2016-09-25 NOTE — Patient Instructions (Signed)
Please take your medications as prescribed Follow up in 3-4 months

## 2016-09-25 NOTE — Progress Notes (Signed)
   CC: Diabetes follow up  HPI:  [name] is a 65 y.o. woman with history of DM2, HLD, sarcoidosis presenting for follow up of her diabetes.  Still has stool hemoccult cards Mammogram - waiting on scholarship  She had not been taking her lovastatin for a couple months prior to her cholesterol check She needs refill of her glipizide   Past Medical History:  Diagnosis Date  . DM type 2 (diabetes mellitus, type 2) (HCC) 2006  . Hyperlipidemia   . Sarcoidosis of lung (HCC) 04/27/08   Followed by Dr. Delton CoombesByrum, CT 09/01/2008 - bilateral patchy airspace disease, mild traction bronchiectasis with lower lobes,    hilar adenopathy, mediastinal adenopathy, approximately the same in degree is noted on 03/09/2008 CT    Review of Systems:   No fevers or chills No chest pain  Physical Exam:  Vitals:   09/25/16 1359  BP: 122/73  Pulse: 78  Temp: 97.9 F (36.6 C)  TempSrc: Oral  SpO2: 98%  Weight: 180 lb 1.6 oz (81.7 kg)   General Apperance: NAD HEENT: Normocephalic, atraumatic, anicteric sclera Neck: Supple, trachea midline Lungs: Clear to auscultation bilaterally. No wheezes, rhonchi or rales. Breathing comfortably Heart: Regular rate and rhythm, no murmur/rub/gallop Abdomen: Soft, nontender, nondistended, no rebound/guarding Extremities: Warm and well perfused, no edema Skin: No rashes or lesions Neurologic: Alert and interactive. No gross deficits.   Assessment & Plan:   See Encounters Tab for problem based charting.  Patient discussed with Dr. Cyndie ChimeGranfortuna

## 2016-09-26 DIAGNOSIS — E559 Vitamin D deficiency, unspecified: Secondary | ICD-10-CM | POA: Insufficient documentation

## 2016-09-26 NOTE — Assessment & Plan Note (Signed)
Should be on high intensity statin by cholesterol results but she would like to try to be more compliant of the lovastatin and continue to make lifestyle changes.

## 2016-09-26 NOTE — Assessment & Plan Note (Signed)
Vitamin D 22.7. Will start vitamin d 800units daily for maintenance.

## 2016-09-26 NOTE — Progress Notes (Signed)
Medicine attending: Medical history, presenting problems, physical findings, and medications, reviewed with resident physician Dr Jennifer Krall on the day of the patient visit and I concur with her evaluation and management plan. 

## 2016-09-26 NOTE — Assessment & Plan Note (Signed)
Assessment: Hgb A1c improved from 8.5 to 7.7.   Plan: Continue metformin 1000mg  BID and glipizide 10mg  BID. Follow up in 3 months. Retinal scan done today.

## 2016-10-01 ENCOUNTER — Encounter: Payer: Self-pay | Admitting: Dietician

## 2016-11-28 ENCOUNTER — Other Ambulatory Visit (INDEPENDENT_AMBULATORY_CARE_PROVIDER_SITE_OTHER): Payer: Self-pay

## 2016-11-28 DIAGNOSIS — Z1211 Encounter for screening for malignant neoplasm of colon: Secondary | ICD-10-CM

## 2016-11-28 LAB — POC HEMOCCULT BLD/STL (HOME/3-CARD/SCREEN)
Card #2 Fecal Occult Blod, POC: NEGATIVE
Card #3 Fecal Occult Blood, POC: NEGATIVE

## 2017-01-29 ENCOUNTER — Encounter: Payer: Self-pay | Admitting: Pulmonary Disease

## 2017-07-09 ENCOUNTER — Other Ambulatory Visit: Payer: Self-pay | Admitting: Obstetrics and Gynecology

## 2017-07-09 DIAGNOSIS — Z1231 Encounter for screening mammogram for malignant neoplasm of breast: Secondary | ICD-10-CM

## 2017-07-23 ENCOUNTER — Ambulatory Visit
Admission: RE | Admit: 2017-07-23 | Discharge: 2017-07-23 | Disposition: A | Discharge status: Home / Self Care | Payer: No Typology Code available for payment source | Source: Ambulatory Visit | Attending: Obstetrics and Gynecology | Admitting: Obstetrics and Gynecology

## 2017-07-23 ENCOUNTER — Ambulatory Visit (HOSPITAL_COMMUNITY)
Admission: RE | Admit: 2017-07-23 | Discharge: 2017-07-23 | Disposition: A | Discharge status: Home / Self Care | Payer: Self-pay | Source: Ambulatory Visit | Attending: Obstetrics and Gynecology | Admitting: Obstetrics and Gynecology

## 2017-07-23 ENCOUNTER — Encounter (HOSPITAL_COMMUNITY): Payer: Self-pay

## 2017-07-23 VITALS — BP 136/86 | Temp 98.3°F | Wt 171.4 lb

## 2017-07-23 DIAGNOSIS — Z1231 Encounter for screening mammogram for malignant neoplasm of breast: Secondary | ICD-10-CM

## 2017-07-23 DIAGNOSIS — Z01419 Encounter for gynecological examination (general) (routine) without abnormal findings: Secondary | ICD-10-CM

## 2017-07-23 NOTE — Patient Instructions (Signed)
Explained breast self awareness with [name]. Let patient know [facility] will cover Pap smears and HPV typing every 5 years unless has a history of abnormal Pap smears. Referred patient to the [facility] for a screening mammogram. Appointment scheduled for Thursday, July 23, 2017 at 0940. Let patient know will follow up with her within the next couple weeks with results with results of Pap smear by phone. Informed patient that the Breast Center will follow up with her within the next couple of weeks with results of mammogram by letter or phone. [name] verbalized understanding.  Ayaana Biondo, Kathaleen Maser, RN 8:31 AM

## 2017-07-23 NOTE — Addendum Note (Signed)
Encounter addended by: Priscille Heidelberg, RN on: 07/23/2017  9:09 AM<BR>    Actions taken: Sign clinical note

## 2017-07-23 NOTE — Addendum Note (Signed)
Encounter addended by: Lynnell Dike, LPN on: 16/07/9603 10:14 AM<BR>    Actions taken: Order list changed

## 2017-07-23 NOTE — Progress Notes (Addendum)
No complaints today.   Pap Smear: Pap smear completed today. Last Pap smear was 03/20/2014 at the free cervical cancer screening at [facility] and normal per patient. Per patient has no history of an abnormal Pap smear. Last Pap smear result is in EPIC.  Physical exam: Breasts Breasts symmetrical. No skin abnormalities bilateral breasts. No nipple retraction bilateral breasts. No nipple discharge bilateral breasts. No lymphadenopathy. No lumps palpated bilateral breasts. No complaints of pain or tenderness on exam. Referred patient to the [facility] for a screening mammogram. Appointment scheduled for Thursday, July 23, 2017 at 0940.  Pelvic/Bimanual   Ext Genitalia No lesions, no swelling and no discharge observed on external genitalia.         Vagina Vagina pink and normal texture. No lesions or discharge observed in vagina.          Cervix Cervix is present. Cervix pink and of normal texture. No discharge observed.     Uterus Uterus is present and palpable. Uterus in normal position and normal size.        Adnexae Bilateral ovaries present and palpable. No tenderness on palpation.          Rectovaginal No rectal exam completed today since patient had no rectal complaints. No skin abnormalities observed on exam.    Smoking History: Patient is a former smoker.  Patient Navigation: Patient education provided. Access to services provided for patient through [facility] program. Spanish interpreter provided.   Colorectal Cancer Screening: Per patient has never had a colonoscopy completed. No complaints today. FIT Test given to patient to complete and return to [facility].  Used Spanish interpreter [employer] from [place].

## 2017-07-24 ENCOUNTER — Encounter (HOSPITAL_COMMUNITY): Payer: Self-pay | Admitting: *Deleted

## 2017-07-24 LAB — CYTOLOGY - PAP
Diagnosis: NEGATIVE
HPV: NOT DETECTED

## 2017-07-29 ENCOUNTER — Telehealth (HOSPITAL_COMMUNITY): Payer: Self-pay | Admitting: *Deleted

## 2017-07-29 NOTE — Telephone Encounter (Signed)
Telephoned patient at home number and advised patient of negative pap smear results. HPV was negative. Next pap smear due in five years. Patient voiced understanding. Used interpreter Delorise RoyalsJulie Sowell. Patient will send Fit Test as soon as completed.

## 2017-12-16 ENCOUNTER — Other Ambulatory Visit (HOSPITAL_COMMUNITY): Payer: Self-pay

## 2017-12-16 DIAGNOSIS — Z0000 Encounter for general adult medical examination without abnormal findings: Secondary | ICD-10-CM

## 2017-12-17 NOTE — Addendum Note (Signed)
Addended by: Tyson DenseAYLOR, Beryle Bagsby B on: 12/17/2017 02:36 PM   Modules accepted: Orders

## 2017-12-17 NOTE — Addendum Note (Signed)
Addended by: Yandell Mcjunkins B on: 12/17/2017 02:36 PM   Modules accepted: Orders  

## 2017-12-18 ENCOUNTER — Inpatient Hospital Stay: Payer: No Typology Code available for payment source

## 2017-12-18 ENCOUNTER — Inpatient Hospital Stay: Payer: No Typology Code available for payment source | Attending: Obstetrics and Gynecology | Admitting: *Deleted

## 2017-12-18 VITALS — BP 118/78 | Ht 60.0 in | Wt 181.3 lb

## 2017-12-18 DIAGNOSIS — Z0000 Encounter for general adult medical examination without abnormal findings: Secondary | ICD-10-CM

## 2017-12-18 DIAGNOSIS — D869 Sarcoidosis, unspecified: Secondary | ICD-10-CM | POA: Insufficient documentation

## 2017-12-18 LAB — LIPID PANEL
Cholesterol: 282 mg/dL — ABNORMAL HIGH (ref 0–200)
HDL: 55 mg/dL (ref >40)
LDL CALC: 204 mg/dL — AB (ref 0–99)
TRIGLYCERIDES: 117 mg/dL (ref <150)
Total CHOL/HDL Ratio: 5.1 RATIO
VLDL: 23 mg/dL (ref 0–40)

## 2017-12-18 NOTE — Patient Instructions (Signed)
Health Coaching: Patient is wanting to work on nutrition and exercise. Spoke with patient regarding ways to eat healthy. Pamphlets and [employer] gifts given.

## 2017-12-18 NOTE — Progress Notes (Signed)
[facility] INITIAL SCREENING  Interpreter: Bobbe MedicoGrecia Nandin  Clinical Measurement: HT: 60in WT: 181.3lbs BP: 118/80 BPx2: 118/78 fasting labs drawn today, will review with patient when they result.  Medical History: Patient states that she has not been diagnosed with any of the following conditions: HTN or heart disease. Patient has been diagnosed with hperlipidemia  and diabetes.  Medications: Patient does not take medications for HTN. She is prescribed medication for hyperlipidemia and diabetes. She is currently out of her medications. She is not taking aspirin daily to prevent heart attack or stroke.   Blood Pressure, Self Measurement: Patient does measure her blood pressure at home a few times a week. She shares her results with her physician.   Nutrition: Patient states that she eats 1 cup of fruit , and 0 cups of vegetables in an average day. She does eat fish two or more times a week. She eats more than half a serving of whole grains daily. She drinks less than 36 ounces of beverages with added sugar weekly. She is not currently watching her sodium intake. She has not had any drinks containing alcohol in the last 7 days.   Physical Activity: Patient states that she gets 90 minutes of moderate exercise in a week. She gets 90 minutes of vigorous exercise in a week.  Smoking Status:Patient smoked approximately 20 years ago. She is not around any smokers.  Quality of Life:Patient states that she has has had 7 bad physical health out of the last 30 days. In the last 2 weeks she has had 0 days that she has felt down or depressed. She has had 0 days in the last 2 weeks that she has had little interest or pleasure in doing things.  Risk Reduction and Counseling: Discussed with patient healthy eating and encouraged her to continue exercising.   Navigation: Will notify patient of lab results within a week. Patient aware of two more health coaching sessions and a follow up.

## 2017-12-19 LAB — HEMOGLOBIN A1C
Hgb A1c MFr Bld: 7.4 % — ABNORMAL HIGH (ref 4.8–5.6)
Mean Plasma Glucose: 166 mg/dL

## 2017-12-30 ENCOUNTER — Telehealth (HOSPITAL_COMMUNITY): Payer: Self-pay

## 2017-12-30 NOTE — Telephone Encounter (Signed)
Health Coaching #2    Interpreter: Delorise RoyalsJulie Sowell  Lab results: Phoned patient with interpreter. Fasting lab results total cholesterol 282, HDL 55, LDL 204, triglycerides 117, A1C 7.4 and glucose 166. Patient voiced understanding about lab results.   Goals: Patient is working on goals to eat healthier and to increase exercise.   Navigation: Patient agreed to referral to internal medicine. She understands that there will be [facility].

## 2018-01-07 ENCOUNTER — Ambulatory Visit (INDEPENDENT_AMBULATORY_CARE_PROVIDER_SITE_OTHER): Payer: Self-pay | Admitting: Internal Medicine

## 2018-01-07 ENCOUNTER — Ambulatory Visit (HOSPITAL_COMMUNITY)
Admission: RE | Admit: 2018-01-07 | Discharge: 2018-01-07 | Disposition: A | Discharge status: Home / Self Care | Payer: No Typology Code available for payment source | Source: Ambulatory Visit | Attending: Family Medicine | Admitting: Family Medicine

## 2018-01-07 VITALS — BP 122/79 | HR 107 | Temp 98.6°F | Wt 181.7 lb

## 2018-01-07 DIAGNOSIS — D869 Sarcoidosis, unspecified: Secondary | ICD-10-CM

## 2018-01-07 DIAGNOSIS — D860 Sarcoidosis of lung: Secondary | ICD-10-CM

## 2018-01-07 DIAGNOSIS — Z79899 Other long term (current) drug therapy: Secondary | ICD-10-CM

## 2018-01-07 DIAGNOSIS — E113292 Type 2 diabetes mellitus with mild nonproliferative diabetic retinopathy without macular edema, left eye: Secondary | ICD-10-CM

## 2018-01-07 DIAGNOSIS — R000 Tachycardia, unspecified: Secondary | ICD-10-CM | POA: Insufficient documentation

## 2018-01-07 DIAGNOSIS — Z7984 Long term (current) use of oral hypoglycemic drugs: Secondary | ICD-10-CM

## 2018-01-07 DIAGNOSIS — E785 Hyperlipidemia, unspecified: Secondary | ICD-10-CM

## 2018-01-07 DIAGNOSIS — Z87891 Personal history of nicotine dependence: Secondary | ICD-10-CM

## 2018-01-07 DIAGNOSIS — E113299 Type 2 diabetes mellitus with mild nonproliferative diabetic retinopathy without macular edema, unspecified eye: Secondary | ICD-10-CM

## 2018-01-07 MED ORDER — SITAGLIPTIN PHOS-METFORMIN HCL 50-1000 MG PO TABS: 1.0000 | ORAL_TABLET | Freq: Two times a day (BID) | ORAL | 11 refills | Status: DC

## 2018-01-07 MED ORDER — ATORVASTATIN CALCIUM 40 MG PO TABS: 40.0000 mg | ORAL_TABLET | Freq: Every day | ORAL | 11 refills | Status: DC

## 2018-01-07 MED FILL — JANUMET 50-1,000 MG TABLET: 50-1000 | 30 days supply | Qty: 60 | Fill #0

## 2018-01-07 MED FILL — ATORVASTATIN 40 MG TABLET: 40 | 30 days supply | Qty: 30 | Fill #0

## 2018-01-07 NOTE — Assessment & Plan Note (Addendum)
She was little tachycardic and complaining of intermittent palpitations. She was feeling excessively hot and her palms were sweaty.  EKG done in the clinic was without any acute change, shows heart rate of 93 with left axis deviation.  -We will check TSH, CBC and BMP.  Addendum.  TSH within lower normal limit, we will check free T4.

## 2018-01-07 NOTE — Assessment & Plan Note (Signed)
A1c done during current wellness visit on December 18, 2017 was 7.4, improved from her previous A1c of 7.7 which was done more than a year ago. Patient occasionally checks her blood sugar at home. Currently she is just taking Metformin, she has stopped taking glipizide many months ago as she ran out of her refills and had no insurance. She denies any blurry vision, tingling and numbness of hands and feet.  -Discontinue metformin. -Start her on Janumet 50-1000 twice daily. -Follow-up in 4328-month with PCP to repeat  A1c.

## 2018-01-07 NOTE — Progress Notes (Signed)
   CC: For follow-up of her diabetes, hyperlipidemia and to reestablish care.  HPI:  [name] is a 67 y.o. with past medical history as listed below came to the clinic for follow-up of her diabetes, hyperlipidemia.  Patient last seen in clinic was in 2017. She is just using Metformin thousand milligrams twice daily.  Never refilled any other medication which includes statin and glipizide.  She was complaining of occasional palpitations, denies chest pain or shortness of breath.  She does feel excessively hot.  She denies any upper respiratory symptoms fever, night sweats. She denies any recent change in her bowel habits or appetite. She is a formal-smoker-quit in [id number]. Denies any alcohol or illicit drug use.  Past Medical History:  Diagnosis Date  . DM type 2 (diabetes mellitus, type 2) (HCC) 2006  . Hyperlipidemia   . Sarcoidosis of lung (HCC) 04/27/08   Followed by Dr. Delton CoombesByrum, CT 09/01/2008 - bilateral patchy airspace disease, mild traction bronchiectasis with lower lobes,    hilar adenopathy, mediastinal adenopathy, approximately the same in degree is noted on 03/09/2008 CT   Review of Systems: Except mentioned in HPI.  Physical Exam:  Vitals:   01/07/18 1341  BP: 122/79  Pulse: (!) 107  Temp: 98.6 F (37 C)  TempSrc: Oral  SpO2: 97%  Weight: 181 lb 11.2 oz (82.4 kg)   Vitals:   01/07/18 1341  BP: 122/79  Pulse: (!) 107  Temp: 98.6 F (37 C)  TempSrc: Oral  SpO2: 97%  Weight: 181 lb 11.2 oz (82.4 kg)   General: Vital signs reviewed.  Patient is well-developed and well-nourished, in no acute distress and cooperative with exam.  Head: Normocephalic and atraumatic. Eyes: EOMI, conjunctivae normal, no scleral icterus.  Neck: Supple, trachea midline, normal ROM, no JVD, masses, thyromegaly, or carotid bruit present.  Cardiovascular: Sinus tachycardia, no murmurs, gallops, or rubs. Pulmonary/Chest: Clear to auscultation bilaterally, no wheezes, rales, or  rhonchi. Abdominal: Soft, non-tender, non-distended, BS +, no masses, organomegaly, or guarding present.  Extremities: Sweaty palms. no lower extremity edema bilaterally,  pulses symmetric and intact bilaterally. No cyanosis or clubbing. Skin: Warm, dry and intact. No rashes or erythema. Psychiatric: Normal mood and affect. speech and behavior is normal. Cognition and memory are normal.  Assessment & Plan:   See Encounters Tab for problem based charting.  Patient discussed with Dr. Heide SparkNarendra.

## 2018-01-07 NOTE — Assessment & Plan Note (Signed)
She stopped taking her statin many month ago. Current lipid profile shows total cholesterol of 282 with LDL of 204 and HDL of 55.  Her a ASCVD risk was 6.1% and she is diabetic.  She was given a prescription of Lipitor 40 mg daily.

## 2018-01-07 NOTE — Patient Instructions (Addendum)
Thank you for visiting clinic today. As we discussed I am changing your diabetes medicine, you will stop taking Metformin now and start taking Janumet, it is a combination of Metformin and sitagliptin.  You will take it 1 tablet twice daily the way you used to take your Metformin. I am also sending a prescription of Lipitor 40 mg daily take it with dinner. We are checking some labs-we will call you with any abnormal results. Also did an EKG on you which was without any new changes. Please follow-up in 3 months with your PCP.

## 2018-01-07 NOTE — Assessment & Plan Note (Signed)
Patient denies any respiratory symptoms. Her sarcoidosis is stable and in remission.

## 2018-01-08 LAB — CBC
HEMOGLOBIN: 14.6 g/dL (ref 11.1–15.9)
Hematocrit: 42.7 % (ref 34.0–46.6)
MCH: 28.4 pg (ref 26.6–33.0)
MCHC: 34.2 g/dL (ref 31.5–35.7)
MCV: 83 fL (ref 79–97)
Platelets: 232 10*3/uL (ref 150–379)
RBC: 5.14 x10E6/uL (ref 3.77–5.28)
RDW: 14 % (ref 12.3–15.4)
WBC: 5.3 10*3/uL (ref 3.4–10.8)

## 2018-01-08 LAB — BMP8+ANION GAP
ANION GAP: 19 mmol/L — AB (ref 10.0–18.0)
BUN/Creatinine Ratio: 29 — ABNORMAL HIGH (ref 12–28)
BUN: 19 mg/dL (ref 8–27)
CALCIUM: 9.8 mg/dL (ref 8.7–10.3)
CHLORIDE: 99 mmol/L (ref 96–106)
CO2: 22 mmol/L (ref 20–29)
Creatinine, Ser: 0.65 mg/dL (ref 0.57–1.00)
GFR calc Af Amer: 108 mL/min/{1.73_m2} (ref >59)
GFR calc non Af Amer: 93 mL/min/{1.73_m2} (ref >59)
GLUCOSE: 180 mg/dL — AB (ref 65–99)
POTASSIUM: 4 mmol/L (ref 3.5–5.2)
Sodium: 140 mmol/L (ref 134–144)

## 2018-01-08 LAB — TSH: TSH: 0.627 u[IU]/mL (ref 0.450–4.500)

## 2018-01-08 NOTE — Addendum Note (Signed)
Addended by: Arnetha CourserAMIN, Gavriel Holzhauer on: 01/08/2018 12:01 PM   Modules accepted: Orders

## 2018-01-08 NOTE — Progress Notes (Signed)
Internal Medicine Clinic Attending  Case discussed with Dr. Amin at the time of the visit.  We reviewed the resident's history and exam and pertinent patient test results.  I agree with the assessment, diagnosis, and plan of care documented in the resident's note.    

## 2018-01-11 LAB — SPECIMEN STATUS REPORT

## 2018-01-11 LAB — T4, FREE: Free T4: 1.39 ng/dL (ref 0.82–1.77)

## 2018-01-18 ENCOUNTER — Ambulatory Visit: Payer: No Typology Code available for payment source

## 2020-09-01 ENCOUNTER — Encounter (HOSPITAL_COMMUNITY): Payer: Self-pay

## 2020-09-01 ENCOUNTER — Ambulatory Visit (HOSPITAL_COMMUNITY)
Admission: EM | Admit: 2020-09-01 | Discharge: 2020-09-01 | Disposition: A | Discharge status: Home / Self Care | Payer: Self-pay

## 2020-09-01 ENCOUNTER — Ambulatory Visit (HOSPITAL_COMMUNITY): Payer: Self-pay

## 2020-09-01 DIAGNOSIS — L02211 Cutaneous abscess of abdominal wall: Secondary | ICD-10-CM

## 2020-09-01 HISTORY — DX: Type 2 diabetes mellitus without complications: E11.9

## 2020-09-01 HISTORY — DX: Pure hypercholesterolemia, unspecified: E78.00

## 2020-09-01 MED ORDER — DOXYCYCLINE HYCLATE 100 MG PO CAPS: 100.0000 mg | ORAL_CAPSULE | Freq: Two times a day (BID) | ORAL | 0 refills | Status: DC

## 2020-09-01 NOTE — ED Triage Notes (Signed)
Pt presents with swelling around her mid abdominal area. She states she injected insulin 8 days ago and the swelling and redness began days after. She states she applied aloe on the swollen area but it got worse. The pt states the area does not hurt to the touch at all.

## 2020-09-01 NOTE — ED Provider Notes (Signed)
[facility]   MRN: [id number] DOB: 10-Aug-1951  Subjective:   [name] is a 69 y.o. female presenting for 1 week history of persistent and worsening swelling of her right abdomen.  Patient is not on insulin diabetic, states that the problem happened after she used her insulin injection.  Denies fever, nausea, vomiting, pain, spontaneous drainage of pus.  She has been using aloe to the area.  No current facility-administered medications for this encounter. No current outpatient medications on file.   No Known Allergies  Past Medical History:  Diagnosis Date  . Diabetes mellitus without complication (HCC)   . High cholesterol      History reviewed. No pertinent surgical history.  Family History  Problem Relation Age of Onset  . Renal Disease Father     Social History   Tobacco Use  . Smoking status: Former Games developer  . Smokeless tobacco: Never Used  Substance Use Topics  . Alcohol use: Never  . Drug use: Never    ROS   Objective:   Vitals: BP 131/71 (BP Location: Right Arm)   Pulse 83   Temp 98.1 F (36.7 C) (Oral)   Resp 20   SpO2 98%   Physical Exam Constitutional:      General: She is not in acute distress.    Appearance: Normal appearance. She is well-developed. She is not ill-appearing, toxic-appearing or diaphoretic.  HENT:     Head: Normocephalic and atraumatic.     Nose: Nose normal.     Mouth/Throat:     Mouth: Mucous membranes are moist.     Pharynx: Oropharynx is clear.  Eyes:     General: No scleral icterus.       Right eye: No discharge.        Left eye: No discharge.     Extraocular Movements: Extraocular movements intact.     Conjunctiva/sclera: Conjunctivae normal.     Pupils: Pupils are equal, round, and reactive to light.  Cardiovascular:     Rate and Rhythm: Normal rate.  Pulmonary:     Effort: Pulmonary effort is normal.  Abdominal:     General: Bowel sounds are normal. There is no distension.      Palpations: Abdomen is soft. There is no mass.     Tenderness: There is abdominal tenderness. There is no guarding or rebound.    Skin:    General: Skin is warm and dry.  Neurological:     General: No focal deficit present.     Mental Status: She is alert and oriented to person, place, and time.  Psychiatric:        Mood and Affect: Mood normal.        Behavior: Behavior normal.        Thought Content: Thought content normal.        Judgment: Judgment normal.    PROCEDURE NOTE: I&D of Abscess Verbal consent obtained. Local anesthesia with 2cc of 1% lidocaine with epi. Site cleansed with alcohol swabs. Incision of 1cm was made using a 11 blade, discharge of~ 4cc-5cc of pus and serosanguinous fluid. Wound cavity was explored with curved hemostats and loculations loosened. Cleansed and dressed.   Assessment and Plan :   PDMP not reviewed this encounter.  1. Abscess of skin of abdomen     Successful I&D. Counseled patient on wound care. Start doxycycline for abscess. Use APAP and/or ibu otherwise for pain control. Follow up with PCP or here if symptoms are  not improving. Counseled patient on potential for adverse effects with medications prescribed/recommended today, ER and return-to-clinic precautions discussed, patient verbalized understanding.    Wallis Bamberg, PA-C 09/01/20 1242

## 2020-09-01 NOTE — Discharge Instructions (Signed)
Por favor cambie su gauza 2-3 veces al dia. Cada vez que la cambia limpia su herida y trate de sacar pus. Tome el [employer] veces al dia por 10 dias. Tome 500mg -650mg  de Tylenol cada 6 horas para dolor, fiebre. Tambien puede usar ibuprofen 400mg -600mg  cada 6 horas para dolor y inflamacion.

## 2020-09-11 ENCOUNTER — Other Ambulatory Visit (HOSPITAL_COMMUNITY): Payer: Self-pay | Admitting: Internal Medicine

## 2020-09-11 ENCOUNTER — Encounter: Payer: Self-pay | Admitting: Internal Medicine

## 2020-09-11 ENCOUNTER — Ambulatory Visit: Payer: Self-pay | Admitting: Internal Medicine

## 2020-09-11 VITALS — BP 129/69 | HR 79 | Temp 98.0°F | Ht 61.0 in | Wt 174.3 lb

## 2020-09-11 DIAGNOSIS — Z7984 Long term (current) use of oral hypoglycemic drugs: Secondary | ICD-10-CM

## 2020-09-11 DIAGNOSIS — E113292 Type 2 diabetes mellitus with mild nonproliferative diabetic retinopathy without macular edema, left eye: Secondary | ICD-10-CM

## 2020-09-11 DIAGNOSIS — I10 Essential (primary) hypertension: Secondary | ICD-10-CM

## 2020-09-11 DIAGNOSIS — D869 Sarcoidosis, unspecified: Secondary | ICD-10-CM

## 2020-09-11 DIAGNOSIS — L02211 Cutaneous abscess of abdominal wall: Secondary | ICD-10-CM

## 2020-09-11 DIAGNOSIS — L0291 Cutaneous abscess, unspecified: Secondary | ICD-10-CM | POA: Insufficient documentation

## 2020-09-11 DIAGNOSIS — L02818 Cutaneous abscess of other sites: Secondary | ICD-10-CM

## 2020-09-11 DIAGNOSIS — K219 Gastro-esophageal reflux disease without esophagitis: Secondary | ICD-10-CM

## 2020-09-11 DIAGNOSIS — E785 Hyperlipidemia, unspecified: Secondary | ICD-10-CM

## 2020-09-11 LAB — GLUCOSE, CAPILLARY: Glucose-Capillary: 140 mg/dL — ABNORMAL HIGH (ref 70–99)

## 2020-09-11 LAB — POCT GLYCOSYLATED HEMOGLOBIN (HGB A1C): Hemoglobin A1C: 7.2 % — AB (ref 4.0–5.6)

## 2020-09-11 MED ORDER — DOXYCYCLINE HYCLATE 100 MG PO CAPS: 100.0000 mg | ORAL_CAPSULE | Freq: Two times a day (BID) | ORAL | 0 refills | Status: DC

## 2020-09-11 MED ORDER — ATORVASTATIN CALCIUM 80 MG PO TABS: 80.0000 mg | ORAL_TABLET | Freq: Every day | ORAL | 5 refills | Status: DC

## 2020-09-11 MED ORDER — BYETTA 5 MCG PEN 5 MCG/0.02ML ~~LOC~~ SOPN: 5.0000 ug | PEN_INJECTOR | Freq: Two times a day (BID) | SUBCUTANEOUS | 5 refills | Status: DC

## 2020-09-11 MED ORDER — METFORMIN HCL 1000 MG PO TABS: 1000.0000 mg | ORAL_TABLET | Freq: Two times a day (BID) | ORAL | 5 refills | Status: DC

## 2020-09-11 MED ORDER — LISINOPRIL 5 MG PO TABS: 5.0000 mg | ORAL_TABLET | Freq: Every day | ORAL | 5 refills | Status: DC

## 2020-09-11 MED ORDER — PEN NEEDLES 32G X 4 MM MISC: 1.0000 | Freq: Two times a day (BID) | 2 refills | Status: DC

## 2020-09-11 MED FILL — DOXYCYCLINE HYCLATE 100 MG: 100 | 5 days supply | Qty: 10 | Fill #0

## 2020-09-11 MED FILL — METFORMIN HCL 1000 MG TABS: 1000 | 30 days supply | Qty: 60 | Fill #0

## 2020-09-11 MED FILL — LISINOPRIL 5 MG TABS: 5 | 30 days supply | Qty: 30 | Fill #0

## 2020-09-11 MED FILL — UNIFINE PENTIPS 32GX5/32: 32G X 4 MM | 30 days supply | Qty: 100 | Fill #0

## 2020-09-11 MED FILL — BYETTA 5 MCG DOSE PEN INJ: 5 | 30 days supply | Qty: 1 | Fill #0

## 2020-09-11 MED FILL — ATORVASTATIN 80 MG TABLET: 80 | 30 days supply | Qty: 30 | Fill #0

## 2020-09-11 NOTE — Assessment & Plan Note (Signed)
Patient is currently on lisinopril 5 mg daily, denies any issues taking her medications.  Blood pressure today is well controlled at 129/69.  We will continue current regimen.

## 2020-09-11 NOTE — Assessment & Plan Note (Signed)
Patient is currently on Novolin-N 30 units daily and Metformin 1000 mg twice a day.  She did clarify that she has only taking Novolin once a day.  She checks her CBGs a few times per week, and will range from 125-160s, denies any low blood sugars.  She does endorse some polyuria area and nocturia, denies any polydipsia.  She was previously following with a different clinic and is switching back to our clinic.  She does not remember being on any oral medications.  She denies any issues taking the insulin.  Her A1c today is 7.2.  We discussed switching patient over to a GLP-1 that may be beneficial for her weight.  Discussed that we can send it to the [facility] outpatient pharmacy which should make it affordable.  She is in agreement with this.  -Stop Novolin daily -Continue Metformin 1000 mg twice a day -Add Byetta 5 mg twice daily -Advised patient to continue monitoring blood sugars and contact us if persistently elevated -Performed foot exam -Patient will need eye exam referral when she is able to obtain orange card -RTC in 3 months

## 2020-09-11 NOTE — Assessment & Plan Note (Signed)
  Patient was noted to have an abdominal abscess around the site of insulin injections on 11/20, she went to the ER to be evaluated and had an I&D and given a 10-day course of doxycycline.  She states that the area has been improving and feels that the redness and swelling has gone down.  On exam she does have some mild erythema, approximately 3 cm in length, with central opening and mild yellow drainage.  No pain on palpation.  She is afebrile and vital signs are stable.  Overall appears to be improving however given the persistent erythema and drainage noted will add additional 5 days of doxycycline to her course.  Vies patient to monitor for persistent symptoms or systemic signs of infection.  -Doxycycline 100 mg twice daily x5 days

## 2020-09-11 NOTE — Assessment & Plan Note (Signed)
Patient is currently on Lipitor 80 mg daily.  Her last lipid panel at a prior clinic showed an LDL of 92.  She is on at this for primary prevention.  We will continue Lipitor 80 mg daily.

## 2020-09-11 NOTE — Progress Notes (Signed)
   CC: HTN, DM, skin abscess, and HLD  HPI:  [name] is a 69 y.o. spanish speaking female with the history listed below presenting for follow up of her HTN, DM, skin abscess, and HLD.   Past Medical History:  Diagnosis Date  . Diabetes mellitus without complication (HCC)   . DM type 2 (diabetes mellitus, type 2) (HCC) 2006  . High cholesterol   . Hyperlipidemia   . Sarcoidosis of lung (HCC) 04/27/08   Followed by Dr. Delton Coombes, CT 09/01/2008 - bilateral patchy airspace disease, mild traction bronchiectasis with lower lobes,    hilar adenopathy, mediastinal adenopathy, approximately the same in degree is noted on 03/09/2008 CT   Review of Systems:   Constitutional: Negative for chills and fever.  Respiratory: Negative for shortness of breath.   Cardiovascular: Negative for chest pain and leg swelling.  Gastrointestinal: Negative for abdominal pain, nausea and vomiting.  Neurological: Negative for dizziness and headaches.   Physical Exam:  Vitals:   09/11/20 1419  BP: 129/69  Pulse: 79  Temp: 98 F (36.7 C)  TempSrc: Oral  SpO2: 99%  Weight: 174 lb 4.8 oz (79.1 kg)  Height: 5\' 1"  (1.549 m)   Physical Exam Constitutional:      Appearance: Normal appearance.  HENT:     Head: Normocephalic and atraumatic.  Eyes:     Conjunctiva/sclera: Conjunctivae normal.     Pupils: Pupils are equal, round, and reactive to light.  Neck:     Thyroid: No thyromegaly.  Cardiovascular:     Rate and Rhythm: Normal rate and regular rhythm.     Heart sounds: Normal heart sounds. No murmur heard.  No friction rub. No gallop.   Pulmonary:     Effort: Pulmonary effort is normal. No respiratory distress.     Breath sounds: Normal breath sounds. No wheezing.  Abdominal:     General: Bowel sounds are normal. There is no distension.     Palpations: Abdomen is soft.  Musculoskeletal:        General: Normal range of motion.     Cervical back: Normal range of motion and neck supple.   Skin:    General: Skin is warm and dry.     Findings: No erythema.       Neurological:     Mental Status: She is alert and oriented to person, place, and time.     Gait: Gait is intact.  Psychiatric:        Mood and Affect: Mood and affect normal.      Assessment & Plan:   See Encounters Tab for problem based charting.  Patient discussed with Dr. 

## 2020-09-11 NOTE — Assessment & Plan Note (Signed)
Patient denies any chest pain, shortness of breath, cough, wheezing, or other respiratory symptoms.  Sarcoidosis is stable and in remission.

## 2020-09-11 NOTE — Patient Instructions (Addendum)
[name],  It was a pleasure to see you today. Thank you for coming in.   Today we discussed your skin issue, diabetes, high blood pressure, and high cholesterol.  I have sent in a prescription for doxycycline to complete 5 additional days of antibiotics. Please keep the area clean and you can use warm compresses on the area.   Your A1c today is 7.2, this looks good today. Please stop taking the insulin, I am switching you to a medication that may have more benefits. Start taking Byetta twice a day 60 minutes before meals. Continue taking metformin twice a day. Contact us if your blood sugars are persistently elevated.   Please make an appointment to see the financial counselor.  Please return to clinic in 3 months or sooner if needed.   Thank you again for coming in.   Hermine Messick M.D.  [name]. [name],  Fue un placer verte hoy. Gracias por venir.  Hoy hablamos sobre su problema de piel, diabetes, presin arterial alta y colesterol alto.  He enviado una receta de doxiciclina para completar 5 das adicionales de antibiticos. Mantenga el rea limpia y puede usar compresas tibias en el rea.  Su A1c de hoy es de 7.2, se ve bien hoy. Deje de tomar la [place], lo cambiar a un medicamento que puede tener ms beneficios. Empiece a tomar [employer] al da 60 minutos antes de las comidas. Contine tomando [employer]. Comunquese con nosotros si sus niveles de azcar en sangre estn elevados de manera persistente.  [name] cita para ver al asesor financiero. Regrese a [name] en 3 meses o antes si es necesario.  Gracias de nuevo por venir.  [name].D.

## 2020-09-12 NOTE — Progress Notes (Signed)
Internal Medicine Clinic Attending ° °Case discussed with Dr. Krienke  At the time of the visit.  We reviewed the resident’s history and exam and pertinent patient test results.  I agree with the assessment, diagnosis, and plan of care documented in the resident’s note.  °

## 2020-09-13 ENCOUNTER — Telehealth: Payer: Self-pay | Admitting: *Deleted

## 2020-09-13 NOTE — Telephone Encounter (Signed)
Received a call from Tania, pt's daughter-in-law stated pt was told via the translator that the doctor was taking her off insulin and starting her on a pill for her diabetes. But when she went to the pharmacy, she picked up  an injection pen (?Byetta)  along with tablets (Metformin). So now the pt is confused; wants to know if she needs to schedule another appt ( dau-in law stated pt is very nervous around doctors). I told her to let me find out exactly what Dr Gwyneth Revels prescribed. Dau-in-law's phone # is [phone]. Thanks

## 2020-09-14 NOTE — Telephone Encounter (Signed)
Contacted patient's daughter in law regarding medications and answered all questions.

## 2020-10-26 MED FILL — BYETTA 5 MCG DOSE PEN INJ: 5 | 30 days supply | Qty: 1 | Fill #1

## 2020-10-26 MED FILL — UNIFINE PENTIPS 32GX5/32: 32G X 4 MM | 30 days supply | Qty: 100 | Fill #1

## 2020-10-30 MED FILL — METFORMIN HCL 1000 MG TABS: 1000 | 30 days supply | Qty: 60 | Fill #0

## 2020-10-30 MED FILL — METFORMIN HCL 1000 MG TABS: 1000 | 30 days supply | Qty: 60 | Fill #0 | Status: TO

## 2020-11-26 MED FILL — METFORMIN HCL 1000 MG TABS: 1000 | 30 days supply | Qty: 60 | Fill #1

## 2020-11-26 MED FILL — BYETTA 5 MCG DOSE PEN INJ: 5 | 30 days supply | Qty: 1 | Fill #2

## 2020-12-06 ENCOUNTER — Other Ambulatory Visit: Payer: Self-pay | Admitting: Student

## 2020-12-06 ENCOUNTER — Encounter: Payer: Self-pay | Admitting: Student

## 2020-12-06 ENCOUNTER — Ambulatory Visit: Payer: Self-pay | Admitting: Student

## 2020-12-06 VITALS — BP 123/63 | HR 88 | Temp 98.4°F | Wt 174.4 lb

## 2020-12-06 DIAGNOSIS — E785 Hyperlipidemia, unspecified: Secondary | ICD-10-CM

## 2020-12-06 DIAGNOSIS — E113292 Type 2 diabetes mellitus with mild nonproliferative diabetic retinopathy without macular edema, left eye: Secondary | ICD-10-CM

## 2020-12-06 DIAGNOSIS — I10 Essential (primary) hypertension: Secondary | ICD-10-CM

## 2020-12-06 LAB — POCT GLYCOSYLATED HEMOGLOBIN (HGB A1C): Hemoglobin A1C: 7.8 % — AB (ref 4.0–5.6)

## 2020-12-06 LAB — GLUCOSE, CAPILLARY: Glucose-Capillary: 170 mg/dL — ABNORMAL HIGH (ref 70–99)

## 2020-12-06 MED ORDER — INVOKAMET 50-1000 MG PO TABS
1.0000 | ORAL_TABLET | Freq: Two times a day (BID) | ORAL | 0 refills | Status: DC
Start: 2020-12-06 — End: 2020-12-31

## 2020-12-06 MED FILL — INVOKAMET 50-1,000 MG TAB: 50-1000 | 30 days supply | Qty: 60 | Fill #0

## 2020-12-06 NOTE — Assessment & Plan Note (Signed)
Patient's hemoglobin A1c elevated to 7.8 from 7.2 following cessation of novolin and transition to Byetta. She has been tolerating metformin 1000mg  twice daily and Byetta twice daily well without adverse effects. She checks her blood glucose readings fasting every morning which have ranged between 130-180. Patient has no history of DKA. Patient would benefit from escalation of her regimen. -Start invokamet 50-1000mg  twice daily -Discontinue metformin 1000mg  twice daily -Continue Byetta twice daily -HbA1c in 3 months -Continue monitoring fasting blood glucose daily

## 2020-12-06 NOTE — Assessment & Plan Note (Addendum)
Patient prescribed atorvastatin 80mg  daily which she takes nightly. She is tolerating this medication well. -Continue atorvastatin 80mg  daily

## 2020-12-06 NOTE — Patient Instructions (Signed)
[name],  Fue un placer conocerte en la clnica hoy.  En general, ests haciendo un gran [name]! Recomendamos un cambio menor en su rgimen de medicamentos para la diabetes. Deje de tomar metformina y comience a tomar un nuevo medicamento llamado Invokamet (que es una pldora combinada de invokana y metformina). Tome este nuevo [name] veces al da. Adems, contine usando Byetta dos veces al da. Contine revisando sus lecturas de glucosa en sangre todas las maanas y esperamos ver su progreso en tres meses. Si este medicamento es costoso, comunquese con [place] oficina y podemos considerar opciones de medicamentos alternativos para [name] mejor sus niveles de [name].  Para su hiperlipidemia (colesterol alto): Contine tomando atorvastatina (Lipitor) 80 mg al da.  Para la proteccin de sus riones: Contine tomando 5 mg de lisinopril al da. Recopilaremos un panel metablico bsico hoy para evaluar su funcin renal y electrolitos.  Para su asistencia financiera: hable con la recepcin [employer] programacin de una cita con nuestra clnica para obtener la tarjeta naranja que ayudar a cubrir los anlisis de [place], las pruebas de Airline pilot y los medicamentos.  Si tiene alguna pregunta o inquietud, no dude en comunicarse.  Atentamente, Dr. Jasmine December   [name],  It was a pleasure meeting you in clinic today.  Overall, you are doing a great job! We recommend a minor change to your diabetes medication regimen. Please stop taking the metformin and start taking a new medication called Invokamet (which is a combination pill of invokana and metformin). Please take this new medication twice daily. Also, continue using the Byetta twice daily. Continue checking your blood glucose readings every morning and we look forward to seeing your progress in three months. If this medication is expensive, please contact our office and we can consider alternative  medication options to better control your blood sugars.  For your hyperlipidemia (high cholesterol): Please continue taking atorvastatin (Lipitor) 80mg  daily.  For your kidney protection: Please continue taking the lisinopril 5mg  daily. We will collect a basic metabolic panel today to assess your kidney function and electrolytes.  For your financial assistance: Please discuss with the front desk about setting up an appointment with our clinic to obtain the orange card which will help with covering bloodwork, screening tests and medications.  If you have any questions or concerns at all, please don't hesitate to reach out.  Sincerely, , MD

## 2020-12-06 NOTE — Progress Notes (Signed)
   CC: Follow-up T2DM, HLD, HTN  HPI:  [name] is a 70 y.o. patient with past medical history significant for T2DM, HLD who presents to clinic for follow-up. Refer to problem list for charting of this encounter.  Past Medical History:  Diagnosis Date  . Diabetes mellitus without complication (HCC)   . DM type 2 (diabetes mellitus, type 2) (HCC) 2006  . High cholesterol   . Hyperlipidemia   . Sarcoidosis of lung (HCC) 04/27/08   Followed by Dr. Delton Coombes, CT 09/01/2008 - bilateral patchy airspace disease, mild traction bronchiectasis with lower lobes,    hilar adenopathy, mediastinal adenopathy, approximately the same in degree is noted on 03/09/2008 CT   Review of Systems:  Denies abdominal pain, nausea, vomiting, diarrhea, polyuria, polydipsia, fevers, chills, weight gain/loss, chest pain, shortness of breath.  Physical Exam:  Vitals:   12/06/20 0950  BP: 123/63  Pulse: 88  Temp: 98.4 F (36.9 C)  TempSrc: Oral  SpO2: 99%  Weight: 174 lb 6.4 oz (79.1 kg)   Physical Exam Vitals reviewed.  Constitutional:      General: She is not in acute distress.    Appearance: Normal appearance. She is not ill-appearing.  Cardiovascular:     Rate and Rhythm: Normal rate and regular rhythm.     Pulses: Normal pulses.     Heart sounds: Normal heart sounds.  Pulmonary:     Effort: Pulmonary effort is normal. No respiratory distress.     Breath sounds: Normal breath sounds.  Abdominal:     General: Abdomen is flat. Bowel sounds are normal.     Palpations: Abdomen is soft.     Tenderness: There is no abdominal tenderness.  Skin:    General: Skin is warm and dry.     Capillary Refill: Capillary refill takes less than 2 seconds.  Neurological:     General: No focal deficit present.     Mental Status: She is alert. Mental status is at baseline.  Psychiatric:        Mood and Affect: Mood normal.        Behavior: Behavior normal.    Assessment & Plan:   See Encounters Tab for  problem based charting.  Patient discussed with Dr. Oswaldo Done

## 2020-12-06 NOTE — Assessment & Plan Note (Addendum)
Patient's blood pressure 123/63 on today's visit. She is tolerating lisinopril 5mg  daily without complaints. -Continue lisinopril 5mg  daily -BMP today

## 2020-12-07 LAB — BMP8+ANION GAP
Anion Gap: 17 mmol/L (ref 10.0–18.0)
BUN/Creatinine Ratio: 22 (ref 12–28)
BUN: 13 mg/dL (ref 8–27)
CO2: 21 mmol/L (ref 20–29)
Calcium: 9.2 mg/dL (ref 8.7–10.3)
Chloride: 101 mmol/L (ref 96–106)
Creatinine, Ser: 0.6 mg/dL (ref 0.57–1.00)
GFR calc Af Amer: 108 mL/min/{1.73_m2} (ref >59)
GFR calc non Af Amer: 94 mL/min/{1.73_m2} (ref >59)
Glucose: 182 mg/dL — ABNORMAL HIGH (ref 65–99)
Potassium: 4.5 mmol/L (ref 3.5–5.2)
Sodium: 139 mmol/L (ref 134–144)

## 2020-12-07 NOTE — Progress Notes (Signed)
Internal Medicine Clinic Attending  Case discussed with Dr. Johnson  At the time of the visit.  We reviewed the resident's history and exam and pertinent patient test results.  I agree with the assessment, diagnosis, and plan of care documented in the resident's note.  

## 2020-12-28 ENCOUNTER — Other Ambulatory Visit: Payer: Self-pay | Admitting: Student

## 2020-12-28 DIAGNOSIS — E113292 Type 2 diabetes mellitus with mild nonproliferative diabetic retinopathy without macular edema, left eye: Secondary | ICD-10-CM

## 2020-12-31 ENCOUNTER — Other Ambulatory Visit: Payer: Self-pay | Admitting: Student

## 2020-12-31 DIAGNOSIS — E113292 Type 2 diabetes mellitus with mild nonproliferative diabetic retinopathy without macular edema, left eye: Secondary | ICD-10-CM

## 2020-12-31 MED ORDER — INVOKAMET 50-1000 MG PO TABS: 1.0000 | ORAL_TABLET | Freq: Two times a day (BID) | ORAL | 3 refills | Status: DC

## 2020-12-31 MED FILL — INVOKAMET 50-1,000 MG TAB: 50-1000 | 30 days supply | Qty: 60 | Fill #0

## 2021-01-31 ENCOUNTER — Other Ambulatory Visit (HOSPITAL_COMMUNITY): Payer: Self-pay

## 2021-01-31 ENCOUNTER — Other Ambulatory Visit: Payer: Self-pay | Admitting: Internal Medicine

## 2021-01-31 MED ORDER — UNIFINE PENTIPS 32G X 4 MM MISC
2 refills | Status: DC
  Filled 2021-01-31 – 2021-03-01 (×2): qty 100, 30d supply, fill #0
  Filled 2021-04-08: qty 100, 30d supply, fill #1
  Filled 2021-06-03: qty 100, 30d supply, fill #2

## 2021-01-31 MED FILL — Canagliflozin-Metformin HCl Tab 50-1000 MG: ORAL | 30 days supply | Qty: 60 | Fill #0 | Status: AC

## 2021-01-31 MED FILL — Exenatide Soln Pen-injector 5 MCG/0.02ML: SUBCUTANEOUS | 30 days supply | Qty: 1.2 | Fill #0 | Status: AC

## 2021-02-08 ENCOUNTER — Other Ambulatory Visit (HOSPITAL_COMMUNITY): Payer: Self-pay

## 2021-03-01 ENCOUNTER — Ambulatory Visit (INDEPENDENT_AMBULATORY_CARE_PROVIDER_SITE_OTHER): Payer: Self-pay | Admitting: Student

## 2021-03-01 ENCOUNTER — Other Ambulatory Visit: Payer: Self-pay

## 2021-03-01 ENCOUNTER — Other Ambulatory Visit (HOSPITAL_COMMUNITY): Payer: Self-pay

## 2021-03-01 ENCOUNTER — Encounter: Payer: Self-pay | Admitting: Student

## 2021-03-01 VITALS — BP 99/67 | HR 79 | Temp 98.1°F | Ht 60.0 in | Wt 159.1 lb

## 2021-03-01 DIAGNOSIS — E113292 Type 2 diabetes mellitus with mild nonproliferative diabetic retinopathy without macular edema, left eye: Secondary | ICD-10-CM

## 2021-03-01 DIAGNOSIS — I10 Essential (primary) hypertension: Secondary | ICD-10-CM

## 2021-03-01 DIAGNOSIS — E782 Mixed hyperlipidemia: Secondary | ICD-10-CM

## 2021-03-01 DIAGNOSIS — E0841 Diabetes mellitus due to underlying condition with diabetic mononeuropathy: Secondary | ICD-10-CM

## 2021-03-01 DIAGNOSIS — E1140 Type 2 diabetes mellitus with diabetic neuropathy, unspecified: Secondary | ICD-10-CM | POA: Insufficient documentation

## 2021-03-01 LAB — GLUCOSE, CAPILLARY: Glucose-Capillary: 95 mg/dL (ref 70–99)

## 2021-03-01 LAB — POCT GLYCOSYLATED HEMOGLOBIN (HGB A1C): Hemoglobin A1C: 6.7 % — AB (ref 4.0–5.6)

## 2021-03-01 MED ORDER — EXENATIDE 5 MCG/0.02ML ~~LOC~~ SOPN
5.0000 ug | PEN_INJECTOR | Freq: Two times a day (BID) | SUBCUTANEOUS | 5 refills | Status: DC
  Filled 2021-03-01: qty 1.2, 30d supply, fill #0
  Filled 2021-04-04: qty 1.2, 30d supply, fill #1
  Filled 2021-05-07: qty 1.2, 30d supply, fill #2
  Filled 2021-05-29: qty 1.2, 30d supply, fill #3
  Filled 2021-07-04: qty 1.2, 30d supply, fill #4

## 2021-03-01 MED ORDER — LISINOPRIL 5 MG PO TABS
5.0000 mg | ORAL_TABLET | Freq: Every day | ORAL | 5 refills | Status: DC
  Filled 2021-03-01: qty 30, 30d supply, fill #0

## 2021-03-01 MED ORDER — CANAGLIFLOZIN-METFORMIN HCL 50-1000 MG PO TABS
1.0000 | ORAL_TABLET | Freq: Two times a day (BID) | ORAL | 3 refills | Status: DC
  Filled 2021-03-01: qty 180, 90d supply, fill #0

## 2021-03-01 MED ORDER — LISINOPRIL 5 MG PO TABS
ORAL_TABLET | Freq: Every day | ORAL | 5 refills | Status: DC
  Filled 2021-03-01: qty 30, fill #0

## 2021-03-01 MED ORDER — EXENATIDE 5 MCG/0.02ML ~~LOC~~ SOPN
PEN_INJECTOR | SUBCUTANEOUS | 5 refills | Status: DC
  Filled 2021-03-01: qty 1.2, fill #0

## 2021-03-01 MED ORDER — CANAGLIFLOZIN-METFORMIN HCL 50-1000 MG PO TABS
1.0000 | ORAL_TABLET | Freq: Two times a day (BID) | ORAL | 3 refills | Status: DC
  Filled 2021-03-01: qty 60, 30d supply, fill #0
  Filled 2021-04-04: qty 60, 30d supply, fill #1
  Filled 2021-05-07: qty 60, 30d supply, fill #2
  Filled 2021-05-29: qty 60, 30d supply, fill #3
  Filled 2021-07-04: qty 60, 30d supply, fill #4

## 2021-03-01 MED ORDER — CAPSAICIN 0.075 % EX CREA
1.0000 | TOPICAL_CREAM | Freq: Two times a day (BID) | CUTANEOUS | 0 refills | Status: DC
Start: 2021-03-01 — End: 2021-07-12
  Filled 2021-03-01: qty 57, 29d supply, fill #0

## 2021-03-01 MED ORDER — ROSUVASTATIN CALCIUM 40 MG PO TABS
40.0000 mg | ORAL_TABLET | Freq: Every day | ORAL | 2 refills | Status: DC
  Filled 2021-03-01: qty 30, 30d supply, fill #0
  Filled 2021-04-04: qty 30, 30d supply, fill #1
  Filled 2021-05-07: qty 30, 30d supply, fill #2

## 2021-03-01 NOTE — Patient Instructions (Addendum)
[name],  It was nice seeing you in the clinic today.  Here is a summary what we talked about:  1.  Diabetes: You are doing a great job.  Your A1c is 6.7 today.  Please continue your medication as instructed.  I will also check your urine for signs of kidney damage.  2.  Low blood pressure: I will stop lisinopril today.  3.  Neuropathy: This is likely related to your diabetes.  I prescribed a cream that you can apply daily as needed.  Please let us know if your pain do not improve.  4.  High cholesterol: I will repeat a lipid profile today.  I switched the Lipitor to Crestor, which can have less side effects.  He can also take Tums or Maalox as needed for abdominal pain.  Please return in 3 months  Take care  Dr. Cyndie Chime

## 2021-03-01 NOTE — Progress Notes (Signed)
   CC: Follow-up on diabetes  HPI:  [name] is a 70 y.o. with past medical history of type 2 diabetes, hyperlipidemia, sarcoidosis, who presented to the clinic today to follow-up on her diabetes.  Please see problem based charting for detail  Past Medical History:  Diagnosis Date  . Diabetes mellitus without complication (HCC)   . DM type 2 (diabetes mellitus, type 2) (HCC) 2006  . High cholesterol   . Hyperlipidemia   . Sarcoidosis of lung (HCC) 04/27/08   Followed by Dr. Delton Coombes, CT 09/01/2008 - bilateral patchy airspace disease, mild traction bronchiectasis with lower lobes,    hilar adenopathy, mediastinal adenopathy, approximately the same in degree is noted on 03/09/2008 CT   Review of Systems: As per HPI  Physical Exam:  Vitals:   03/01/21 0946 03/01/21 0951  BP: (!) 123/106 99/67  Pulse: 81 79  Temp: 98.1 F (36.7 C)   TempSrc: Oral   SpO2: 100%   Weight: 159 lb 1.6 oz (72.2 kg)   Height: 5' (1.524 m)    Physical Exam Constitutional:      General: She is not in acute distress.    Appearance: She is not ill-appearing.  HENT:     Head: Normocephalic.  Eyes:     General: No scleral icterus.       Right eye: No discharge.        Left eye: No discharge.     Conjunctiva/sclera: Conjunctivae normal.  Cardiovascular:     Rate and Rhythm: Normal rate and regular rhythm.  Pulmonary:     Effort: Pulmonary effort is normal. No respiratory distress.     Breath sounds: Normal breath sounds. No wheezing.  Musculoskeletal:        General: Normal range of motion.     Comments: Normal sensation, range of motion and strength of bilateral lower extremities.  No pain to palpation of lower back or bilateral hips.  Normal gait.  Normal right knee joint, no pain to palpation, no joint effusion.  No change in the overlying skin of right leg. No wounds also noted bilateral feet.  +2 pedis pulses bilaterally  Skin:    General: Skin is warm.     Coloration: Skin is not  jaundiced.  Neurological:     Mental Status: She is alert.  Psychiatric:        Mood and Affect: Mood normal.        Thought Content: Thought content normal.        Judgment: Judgment normal.     Assessment & Plan:   See Encounters Tab for problem based charting.  Patient discussed with Dr. Heide Spark

## 2021-03-01 NOTE — Assessment & Plan Note (Addendum)
Patient reports compliant with medications.  Denies any side effects.  States that she has not seen in eye doctor for 2 years.  Assessment and plan Repeat A1c of 6.7.  Patient also lost weight, likely due to GLP-1.  No medication changes today.  Foot exam is normal.  -Continue Invokamet and Byetta. -Check urine microalbumin/cr  today -Advised patient to make an appointment with her optometrist -A1c in 3 months  Addendum  Microalbumin/cr WNL

## 2021-03-01 NOTE — Assessment & Plan Note (Addendum)
Patient reports epigastric pain at night after taking Lipitor.  States that she never had this pain before starting Lipitor.  She requests a different medication.  Assessment and plan Unsure if it epigastric pain is related to Lipitor or GERD.  We will switch Lipitor to Crestor 40 mg.  Also advised patient to take Tums or Maalox as needed for epigastric pain.  -Stop Lipitor -Start Crestor 40 mg daily -Repeat lipid panel  Addendum ASCVD 9.1 - 15.1%. Repeat showed LDL of 148. Goal LDL < 100 with DM. Patient reports non-adherence to Lipitor due to epigastric pain. States that her pain resolved after switching to Crestor. Advise patient to take Crestor everyday. Will recheck lipid panel at next visit to assess response to Crestor. If LDL still not at goal, consider adding Zetia.  -Continue Crestor 40 mg -Repeat lipid profile next visit

## 2021-03-01 NOTE — Assessment & Plan Note (Signed)
Patient reports burning pain of her right calf muscle that has been going on for 6 months.  States that pain started at the right calf and radiate up to her inner thigh, worse with sitting and better with straighten her leg, rated 7/10.  She has not tried any medication to help.  Denies pain at night.  Denies lower back pain, groin pain or hip pain or change in gait.  Denies trauma to her back or hip.  Denies family history of blood clots or recent surgery.  Assessment and plan Physical exam is unrevealing.  She has normal sensation and strength of bilateral lower extremity.  There was no back pain or hip pain on palpation.  No sign of symptoms of DVT.  Low suspicion for meralgia paresthetica given her neuropathy is in the inner thigh.  Suspect this is a atypical presentation of diabetic neuropathy.  Will trial capsaicin cream.  Patient will let us know if symptoms do not improve.  -Start capsaicin cream

## 2021-03-01 NOTE — Assessment & Plan Note (Signed)
Blood pressure is low at today's visit: 99/67.  She denies symptoms of lightheadedness or dizziness with positional change.  Patient states that she was started on lisinopril not for high blood pressure but for renal protection.  Last urine microalbumin/cr ratio was unremarkable.  -Will stop lisinopril to prevent hypotension -Recheck urine microalbumin/creatinine ratio

## 2021-03-02 LAB — LIPID PANEL
Chol/HDL Ratio: 3.8 ratio (ref 0.0–4.4)
Cholesterol, Total: 225 mg/dL — ABNORMAL HIGH (ref 100–199)
HDL: 60 mg/dL (ref >39)
LDL Chol Calc (NIH): 148 mg/dL — ABNORMAL HIGH (ref 0–99)
Triglycerides: 98 mg/dL (ref 0–149)
VLDL Cholesterol Cal: 17 mg/dL (ref 5–40)

## 2021-03-02 LAB — MICROALBUMIN / CREATININE URINE RATIO
Creatinine, Urine: 72 mg/dL
Microalb/Creat Ratio: 6 mg/g creat (ref 0–29)
Microalbumin, Urine: 4.6 ug/mL

## 2021-03-04 NOTE — Progress Notes (Signed)
Internal Medicine Clinic Attending  Case discussed with Dr. Nguyen  At the time of the visit.  We reviewed the resident's history and exam and pertinent patient test results.  I agree with the assessment, diagnosis, and plan of care documented in the resident's note. 

## 2021-04-04 ENCOUNTER — Other Ambulatory Visit (HOSPITAL_COMMUNITY): Payer: Self-pay

## 2021-04-08 ENCOUNTER — Other Ambulatory Visit (HOSPITAL_COMMUNITY): Payer: Self-pay

## 2021-05-07 ENCOUNTER — Other Ambulatory Visit (HOSPITAL_COMMUNITY): Payer: Self-pay

## 2021-05-09 ENCOUNTER — Other Ambulatory Visit (HOSPITAL_COMMUNITY): Payer: Self-pay

## 2021-05-29 ENCOUNTER — Other Ambulatory Visit: Payer: Self-pay | Admitting: Student

## 2021-05-29 ENCOUNTER — Other Ambulatory Visit (HOSPITAL_COMMUNITY): Payer: Self-pay

## 2021-05-29 DIAGNOSIS — E782 Mixed hyperlipidemia: Secondary | ICD-10-CM

## 2021-05-30 ENCOUNTER — Other Ambulatory Visit (HOSPITAL_COMMUNITY): Payer: Self-pay

## 2021-05-30 MED ORDER — ROSUVASTATIN CALCIUM 40 MG PO TABS
40.0000 mg | ORAL_TABLET | Freq: Every day | ORAL | 2 refills | Status: DC
  Filled 2021-05-30 – 2021-06-03 (×2): qty 30, 30d supply, fill #0
  Filled 2021-07-04: qty 30, 30d supply, fill #1

## 2021-06-03 ENCOUNTER — Other Ambulatory Visit (HOSPITAL_COMMUNITY): Payer: Self-pay

## 2021-06-04 ENCOUNTER — Other Ambulatory Visit: Payer: Self-pay

## 2021-06-04 DIAGNOSIS — Z1231 Encounter for screening mammogram for malignant neoplasm of breast: Secondary | ICD-10-CM

## 2021-06-04 NOTE — Progress Notes (Signed)
31

## 2021-06-10 ENCOUNTER — Other Ambulatory Visit: Payer: Self-pay | Admitting: Student

## 2021-06-10 DIAGNOSIS — Z1231 Encounter for screening mammogram for malignant neoplasm of breast: Secondary | ICD-10-CM

## 2021-06-13 ENCOUNTER — Encounter: Payer: Self-pay | Admitting: Student

## 2021-06-20 ENCOUNTER — Ambulatory Visit
Admission: RE | Admit: 2021-06-20 | Discharge: 2021-06-20 | Disposition: A | Discharge status: Home / Self Care | Payer: No Typology Code available for payment source | Source: Ambulatory Visit | Attending: Hospitalist | Admitting: Hospitalist

## 2021-06-20 ENCOUNTER — Other Ambulatory Visit: Payer: Self-pay

## 2021-06-24 ENCOUNTER — Other Ambulatory Visit (HOSPITAL_COMMUNITY): Payer: Self-pay

## 2021-07-04 ENCOUNTER — Other Ambulatory Visit (HOSPITAL_COMMUNITY): Payer: Self-pay

## 2021-07-04 ENCOUNTER — Other Ambulatory Visit: Payer: Self-pay | Admitting: Internal Medicine

## 2021-07-08 ENCOUNTER — Other Ambulatory Visit (HOSPITAL_COMMUNITY): Payer: Self-pay

## 2021-07-08 ENCOUNTER — Other Ambulatory Visit: Payer: Self-pay | Admitting: Internal Medicine

## 2021-07-09 NOTE — Telephone Encounter (Signed)
Next appt scheduled 07/12/21 with Dr Ned Card.

## 2021-07-10 ENCOUNTER — Other Ambulatory Visit (HOSPITAL_COMMUNITY): Payer: Self-pay

## 2021-07-12 ENCOUNTER — Ambulatory Visit: Payer: Self-pay | Admitting: Internal Medicine

## 2021-07-12 ENCOUNTER — Other Ambulatory Visit (HOSPITAL_COMMUNITY): Payer: Self-pay

## 2021-07-12 VITALS — BP 121/76 | HR 94 | Temp 97.8°F | Ht 60.0 in | Wt 154.4 lb

## 2021-07-12 DIAGNOSIS — E782 Mixed hyperlipidemia: Secondary | ICD-10-CM

## 2021-07-12 DIAGNOSIS — Z0000 Encounter for general adult medical examination without abnormal findings: Secondary | ICD-10-CM

## 2021-07-12 DIAGNOSIS — Z23 Encounter for immunization: Secondary | ICD-10-CM

## 2021-07-12 DIAGNOSIS — E559 Vitamin D deficiency, unspecified: Secondary | ICD-10-CM

## 2021-07-12 DIAGNOSIS — E113292 Type 2 diabetes mellitus with mild nonproliferative diabetic retinopathy without macular edema, left eye: Secondary | ICD-10-CM

## 2021-07-12 DIAGNOSIS — Z1211 Encounter for screening for malignant neoplasm of colon: Secondary | ICD-10-CM

## 2021-07-12 LAB — POCT GLYCOSYLATED HEMOGLOBIN (HGB A1C): Hemoglobin A1C: 6.4 % — AB (ref 4.0–5.6)

## 2021-07-12 LAB — GLUCOSE, CAPILLARY: Glucose-Capillary: 101 mg/dL — ABNORMAL HIGH (ref 70–99)

## 2021-07-12 MED ORDER — UNIFINE PENTIPS 32G X 4 MM MISC
2 refills | Status: AC
  Filled 2021-07-12: qty 100, 30d supply, fill #0

## 2021-07-12 MED ORDER — ROSUVASTATIN CALCIUM 40 MG PO TABS
40.0000 mg | ORAL_TABLET | Freq: Every day | ORAL | 2 refills | Status: DC
  Filled 2021-07-12: qty 90, 90d supply, fill #0
  Filled 2021-07-29: qty 30, 30d supply, fill #0
  Filled 2021-09-04: qty 30, 30d supply, fill #1
  Filled 2021-09-27: qty 30, 30d supply, fill #2
  Filled 2021-10-30: qty 30, 30d supply, fill #3
  Filled 2021-12-03: qty 30, 30d supply, fill #4
  Filled 2021-12-31: qty 30, 30d supply, fill #5
  Filled 2022-03-04: qty 30, 30d supply, fill #6
  Filled 2022-04-04: qty 30, 30d supply, fill #7
  Filled 2022-05-01: qty 30, 30d supply, fill #8

## 2021-07-12 MED ORDER — CANAGLIFLOZIN-METFORMIN HCL 50-1000 MG PO TABS
1.0000 | ORAL_TABLET | Freq: Two times a day (BID) | ORAL | 3 refills | Status: DC
  Filled 2021-07-12: qty 180, 90d supply, fill #0
  Filled 2021-07-29: qty 60, 30d supply, fill #0
  Filled 2021-09-04: qty 60, 30d supply, fill #1
  Filled 2021-09-27: qty 60, 30d supply, fill #2
  Filled 2021-10-30: qty 60, 30d supply, fill #3
  Filled 2021-12-03: qty 60, 30d supply, fill #4
  Filled 2021-12-31: qty 60, 30d supply, fill #5
  Filled 2022-01-31: qty 60, 30d supply, fill #6
  Filled 2022-03-04: qty 60, 30d supply, fill #7
  Filled 2022-04-04: qty 60, 30d supply, fill #8
  Filled 2022-05-01: qty 60, 30d supply, fill #9
  Filled 2022-05-06: qty 60, 30d supply, fill #10
  Filled 2022-07-02: qty 60, 30d supply, fill #11
  Filled 2022-08-01: qty 60, 30d supply, fill #12
  Filled 2022-09-03: qty 60, 30d supply, fill #13

## 2021-07-12 NOTE — Progress Notes (Signed)
   CC: diabetes follow up   HPI:  [name] is a 70 y.o. patient with type 2 diabetes, HLD, and HTN who presents to the [facility] for a diabetes follow up. Please see problem-based list for further details, assessments, and plans.   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 Delton Coombes, CT 09/01/2008 - bilateral patchy airspace disease, mild traction bronchiectasis with lower lobes,    hilar adenopathy, mediastinal adenopathy, approximately the same in degree is noted on 03/09/2008 CT   Review of Systems:  Review of Systems  Constitutional:  Negative for chills and fever.  HENT:  Negative for congestion and sore throat.   Eyes: Negative.   Respiratory:  Negative for cough and shortness of breath.   Cardiovascular:  Negative for chest pain and palpitations.  Gastrointestinal:  Negative for diarrhea, nausea and vomiting.  Genitourinary: Negative.   Musculoskeletal: Negative.   Neurological:  Negative for dizziness and headaches.    Physical Exam:  Vitals:   07/12/21 0920  BP: 121/76  Pulse: 94  Temp: 97.8 F (36.6 C)  TempSrc: Oral  SpO2: 100%  Weight: 154 lb 6.4 oz (70 kg)  Height: 5' (1.524 m)   General: No acute distress. CV: RRR. No murmurs, rubs, or gallops. No LE edema Pulmonary: Lungs CTAB. Normal effort. No wheezing or rales. Abdominal: Soft, nontender, nondistended. Normal bowel sounds. Extremities: Palpable radial and DP pulses. Normal ROM. Skin: Warm and dry. No obvious rash or lesions. Neuro: A&Ox3. Moves all extremities. Normal sensation. No focal deficit. Psych: Normal mood and affect   Assessment & Plan:   See Encounters Tab for problem based charting.  Patient seen with Dr. Mayford Knife

## 2021-07-12 NOTE — Assessment & Plan Note (Signed)
A1c improved from 6.7 to 6.4 today. The patient has been taking exenatide 5 mcg BID and invokamet 50-1000 mg BID. She does not regularly check her sugars at home and states that she has felt well overall. She denies any side effects to her medications. Patient has not seen an eye doctor in a couple years, as she does not have insurance.  Plan: - Continue Invokamet 50-1000 mg BID - Discontinue Exenatide injection as patient's A1c is at goal - Recheck a1c in 3 months - Will refer to ophtho once patient has orange card

## 2021-07-12 NOTE — Assessment & Plan Note (Signed)
Vitamin D level last checked ~5 years ago and was low. Advised patient to pick up OTC Vit D3 supplement (1000 units) to take once a day. Did not recheck at this visit as the patient does not have insurance and has not been on any supplementation, thus level is likely still low.   Plan: - OTC vitamin d3 supplementation - Will recheck vit d level in future

## 2021-07-12 NOTE — Patient Instructions (Signed)
Teresa Pock, Ms.[name] por permitirnos brindarle atencin hoy. hoy discutimos:  1. diabetes: Contine tomando el invokamet dos veces al da para su nivel de azcar. Puede DEJAR de inyectarse (exanatide) porque su diabetes est muy bien controlada. Lo volveremos a revisar en 3 meses.  2. Colesterol: contine tomando [employer]  3. Mantenimiento de la atencin mdica: vacuna contra la gripe hoy y prueba de heces para cncer de colon  4. Vitamina D: su nivel de vitamina D es bajo, por lo que le recomiendo que tome un suplemento de vitamina D3 de [employer] account executive. Vienen en forma de pldoras o tabletas, y puedes tomar lo que quieras. Tome una pastilla al da y volveremos a controlar su vitamina D en el futuro.  He ordenado los siguientes laboratorios para usted:  Lab Orders         Glucose, capillary         POC Hbg A1C         POC FIT Test      Pruebas ordenadas hoy:  Referencias ordenadas hoy:  Referral Orders  No referral(s) requested today     He ordenado el siguiente medicamento/cambiado los siguientes medicamentos:  Suspender los siguientes medicamentos: Medications Discontinued During This Encounter  Medication Reason   capsicum (ZOSTRIX) 0.075 % topical cream Patient has not taken in last 30 days   Canagliflozin-metFORMIN HCl 50-1000 MG TABS Reorder   rosuvastatin (CRESTOR) 40 MG tablet Reorder   exenatide (BYETTA) 5 MCG/0.02ML SOPN injection Completed Course     Iniciar los siguientes medicamentos: Meds ordered this encounter  Medications   rosuvastatin (CRESTOR) 40 MG tablet    Sig: Take 1 tablet (40 mg total) by mouth daily.    Dispense:  90 tablet    Refill:  2    IM program   Canagliflozin-metFORMIN HCl 50-1000 MG TABS    Sig: Take 1 tablet by mouth 2 (two) times daily.    Dispense:  180 tablet    Refill:  3    IM program     Seguimiento 3 months (3 meses)    Si tiene [place] pregunta o inquietud, llame a la clnica de medicina interna  al [phone].     Elza Rafter, D.O. [facility]

## 2021-07-12 NOTE — Assessment & Plan Note (Signed)
Fit test/stool card provided for colon cancer screening today  Flu shot administered

## 2021-07-12 NOTE — Assessment & Plan Note (Signed)
Patient reports no issues or side effects since being switched to Crestor 40 mg daily. Refilled this medication today.

## 2021-07-12 NOTE — Assessment & Plan Note (Signed)
Blood pressure 121/76, remains well controlled off of all antihypertensives. Patient denies any lightheadedness, dizziness, headache, chest pain, palpitations, or any other sxs at this time.   Plan: - Continue to monitor BP

## 2021-07-29 ENCOUNTER — Other Ambulatory Visit (HOSPITAL_COMMUNITY): Payer: Self-pay

## 2021-07-29 NOTE — Progress Notes (Signed)
Internal Medicine Clinic Attending ° °I saw and evaluated the patient.  I personally confirmed the key portions of the history and exam documented by Dr. Atway and I reviewed pertinent patient test results.  The assessment, diagnosis, and plan were formulated together and I agree with the documentation in the resident’s note.  °

## 2021-09-04 ENCOUNTER — Other Ambulatory Visit (HOSPITAL_COMMUNITY): Payer: Self-pay

## 2021-09-27 ENCOUNTER — Other Ambulatory Visit (HOSPITAL_COMMUNITY): Payer: Self-pay

## 2021-10-30 ENCOUNTER — Other Ambulatory Visit (HOSPITAL_COMMUNITY): Payer: Self-pay

## 2021-11-20 ENCOUNTER — Other Ambulatory Visit (HOSPITAL_COMMUNITY): Payer: Self-pay

## 2021-12-03 ENCOUNTER — Other Ambulatory Visit (HOSPITAL_COMMUNITY): Payer: Self-pay

## 2021-12-31 ENCOUNTER — Other Ambulatory Visit (HOSPITAL_COMMUNITY): Payer: Self-pay

## 2022-01-16 ENCOUNTER — Encounter: Payer: No Typology Code available for payment source | Admitting: Student

## 2022-01-23 ENCOUNTER — Other Ambulatory Visit: Payer: Self-pay

## 2022-01-23 ENCOUNTER — Encounter: Payer: Self-pay | Admitting: Student

## 2022-01-23 ENCOUNTER — Ambulatory Visit: Payer: Self-pay | Admitting: Student

## 2022-01-23 VITALS — BP 121/64 | HR 73 | Temp 97.9°F | Ht 61.0 in | Wt 156.3 lb

## 2022-01-23 DIAGNOSIS — E113292 Type 2 diabetes mellitus with mild nonproliferative diabetic retinopathy without macular edema, left eye: Secondary | ICD-10-CM

## 2022-01-23 DIAGNOSIS — E785 Hyperlipidemia, unspecified: Secondary | ICD-10-CM

## 2022-01-23 DIAGNOSIS — M5416 Radiculopathy, lumbar region: Secondary | ICD-10-CM

## 2022-01-23 DIAGNOSIS — I10 Essential (primary) hypertension: Secondary | ICD-10-CM

## 2022-01-23 DIAGNOSIS — M79644 Pain in right finger(s): Secondary | ICD-10-CM | POA: Insufficient documentation

## 2022-01-23 HISTORY — DX: Radiculopathy, lumbar region: M54.16

## 2022-01-23 LAB — POCT GLYCOSYLATED HEMOGLOBIN (HGB A1C): Hemoglobin A1C: 6.3 % — AB (ref 4.0–5.6)

## 2022-01-23 LAB — GLUCOSE, CAPILLARY: Glucose-Capillary: 118 mg/dL — ABNORMAL HIGH (ref 70–99)

## 2022-01-23 NOTE — Assessment & Plan Note (Signed)
Report pain in her right thumb that started 1 month ago.  Pain is triggered with right thumb flexion, not at rest position. ? ?Physical exam revealed catching motion of the extensor pollicis longus with thumb flexion.  Pain is reproduced at the IP joint with flexion.  There is no pain to palpation of the tendon sheath.  Suspect this could be tenosynovitis. ? ?Recommend conservative treatment with Tylenol/NSAID for pain.  Advised patient to wear thumb splint to avoid excessive thumb flexion and allow healing.  If pain does not improve at next visit, will consider sending her to sport medicine for steroid injection. ?

## 2022-01-23 NOTE — Patient Instructions (Addendum)
[name], ? ?It was a pleasure seeing you in the clinic today.  Here is a summary of what we talked about:. ? ?1.  Diabetes: Your A1c is 6.3.  Great job!Marland Kitchen  Please continue your medication.  I will check blood work for your kidney function today. ? ?2.  Please continue taking Crestor for your cholesterol.  I will check a lipid panel today. ? ?3.  Leg pain: This is likely sciatica.  Please take Tylenol or Ibuprofen as needed for pain.  Avoid activities that can cause the pain to be worse.  You can try light stretching exercise as needed.  If your pain do not improve in the next few months, we will do further work-up. ? ?4.  Right thumb pain: This is likely inflammation of the tendon.  Please obtain a thumb splint and try Tylenol or ibprofen as needed for pain. ? ?Please follow-up in 3 months ? ?Take care, ? ?Dr. Cyndie Chime ?

## 2022-01-23 NOTE — Assessment & Plan Note (Signed)
Please well controlled with A1c of 6.3.  Report adherence to medication.  She has not had an eye exam this year. ? ?-Continue canagliflozin-metformin 50-1000 twice daily ?-Foot exam benign ?-Advised to get an eye exam this year ?-She does not need to check her blood sugar frequently ?

## 2022-01-23 NOTE — Assessment & Plan Note (Addendum)
-   Continue Crestor 40 mg ?- Repeat lipid panel ? ?Addendum ?LDL remains elevated at 130 despite adherence to Crestor 40 mg.  Goal LDL less than 100 and diabetes. ?-Add Zetia 10 mg ?

## 2022-01-23 NOTE — Assessment & Plan Note (Signed)
Patient report lower back pain that radiates down to her left posterior thigh and calf.  Pain has been going on for 3 to 4 months.  She denies pain with walking but has pain after at rest.  Say that it resting and elevate her legs help.  Said the pain is burning sensation.  She has not taken any medication to help. ? ?Physical exam revealed tenderness to palpation of left paraspinal muscle at the lower lumbar.  She got positive straight leg raise test on the left but negative on the right.  There was no edema, erythema or tenderness to palpation at bilateral calfs.  Normal range of motion of bilateral knee joint. ? ?Suspect this is sciatica from lower lumbar nerve impingement.  Recommended conservative treatment including Tylenol or NSAIDs for pain control, avoid activities that exacerbates the pain and light stretching exercise.  I offered physical therapy but patient declined due to the lack of insurance.  I also offer lumbar x-ray but patient would like to get it next time.  If pain do not improve in the next 2 months, will consider obtaining more imaging. ?

## 2022-01-23 NOTE — Assessment & Plan Note (Addendum)
Blood pressure remains well controlled ? ?-Check BMP today ? ?Addendum ? ?BMP unremarkable ?

## 2022-01-23 NOTE — Progress Notes (Signed)
? ?  CC: F/u on diabetes ? ?HPI: ? ?Ms.Kymber Kosar is a 71 y.o. with PMH of HTN, T2DM, HLD presents to the clinic to follow-up on her diabetes. ? ?Please see problem based charting for detail. ? ?Past Medical History:  ?Diagnosis Date  ? Diabetes mellitus without complication (HCC)   ? DM type 2 (diabetes mellitus, type 2) (HCC) 2006  ? High cholesterol   ? Hyperlipidemia   ? 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 Followed by Dr. Delton Coombes, CT 09/01/2008 - bilateral patchy airspace disease, mild traction bronchiectasis with lower lobes,    hilar adenopathy, mediastinal adenopathy, approximately the same in degree is noted on 03/09/2008 CT  ? ?Review of Systems:  per HPI ? ?Physical Exam: ? ?Vitals:  ? 01/23/22 0849  ?BP: 121/64  ?Pulse: 73  ?Temp: 97.9 ?F (36.6 ?C)  ?TempSrc: Oral  ?SpO2: 98%  ?Weight: 156 lb 4.8 oz (70.9 kg)  ?Height: 5\' 1"  (1.549 m)  ? ?Physical Exam ?Constitutional:   ?   General: She is not in acute distress. ?   Appearance: She is not ill-appearing.  ?HENT:  ?   Head: Normocephalic.  ?Eyes:  ?   General:     ?   Right eye: No discharge.     ?   Left eye: No discharge.  ?   Conjunctiva/sclera: Conjunctivae normal.  ?Cardiovascular:  ?   Rate and Rhythm: Normal rate and regular rhythm.  ?   Pulses: Normal pulses.  ?Pulmonary:  ?   Effort: Pulmonary effort is normal. No respiratory distress.  ?   Breath sounds: Normal breath sounds.  ?Abdominal:  ?   General: Bowel sounds are normal.  ?   Palpations: Abdomen is soft.  ?Musculoskeletal:     ?   General: Normal range of motion.  ?   Comments: Positive straight leg raise test on the left.  Negative on the right. ?No pain to palpation of left calf.  No edema or erythema seen. ?Mild tenderness to palpation of the left paraspinal muscle of lower back. ?Normal range of motion of bilateral knee joints. ?Right thumb tendon catching with thumb flexion.  No pain to palpation of the tendon sheath.  ?Skin: ?   General: Skin is warm.  ?Neurological:  ?   General:  No focal deficit present.  ?   Mental Status: She is alert.  ?  ? ?Assessment & Plan:  ? ?See Encounters Tab for problem based charting. ? ?Patient discussed with Dr.  ?

## 2022-01-24 ENCOUNTER — Other Ambulatory Visit (HOSPITAL_COMMUNITY): Payer: Self-pay

## 2022-01-24 LAB — BMP8+ANION GAP
Anion Gap: 14 mmol/L (ref 10.0–18.0)
BUN/Creatinine Ratio: 24 (ref 12–28)
BUN: 12 mg/dL (ref 8–27)
CO2: 26 mmol/L (ref 20–29)
Calcium: 9.8 mg/dL (ref 8.7–10.3)
Chloride: 102 mmol/L (ref 96–106)
Creatinine, Ser: 0.51 mg/dL — ABNORMAL LOW (ref 0.57–1.00)
Glucose: 137 mg/dL — ABNORMAL HIGH (ref 70–99)
Potassium: 4.4 mmol/L (ref 3.5–5.2)
Sodium: 142 mmol/L (ref 134–144)
eGFR: 101 mL/min/{1.73_m2} (ref >59)

## 2022-01-24 LAB — LIPID PANEL
Chol/HDL Ratio: 3 ratio (ref 0.0–4.4)
Cholesterol, Total: 224 mg/dL — ABNORMAL HIGH (ref 100–199)
HDL: 75 mg/dL (ref >39)
LDL Chol Calc (NIH): 130 mg/dL — ABNORMAL HIGH (ref 0–99)
Triglycerides: 109 mg/dL (ref 0–149)
VLDL Cholesterol Cal: 19 mg/dL (ref 5–40)

## 2022-01-24 MED ORDER — EZETIMIBE 10 MG PO TABS: 10.0000 mg | ORAL_TABLET | Freq: Every day | ORAL | 11 refills | Status: DC

## 2022-01-24 MED ORDER — EZETIMIBE 10 MG PO TABS
10.0000 mg | ORAL_TABLET | Freq: Every day | ORAL | 11 refills | Status: DC
  Filled 2022-01-24: qty 30, 30d supply, fill #0
  Filled 2022-09-02: qty 30, 30d supply, fill #1

## 2022-01-24 NOTE — Progress Notes (Signed)
Internal Medicine Clinic Attending  Case discussed with Dr. Nguyen  At the time of the visit.  We reviewed the resident's history and exam and pertinent patient test results.  I agree with the assessment, diagnosis, and plan of care documented in the resident's note. 

## 2022-01-24 NOTE — Addendum Note (Signed)
Addended byGaylan Gerold on: 01/24/2022 11:22 AM ? ? Modules accepted: Orders ? ?

## 2022-01-31 ENCOUNTER — Other Ambulatory Visit (HOSPITAL_COMMUNITY): Payer: Self-pay

## 2022-03-04 ENCOUNTER — Other Ambulatory Visit (HOSPITAL_COMMUNITY): Payer: Self-pay

## 2022-03-14 ENCOUNTER — Other Ambulatory Visit (HOSPITAL_COMMUNITY): Payer: Self-pay

## 2022-03-14 ENCOUNTER — Ambulatory Visit: Payer: Self-pay | Admitting: Internal Medicine

## 2022-03-14 VITALS — BP 128/74 | HR 93 | Temp 98.2°F | Ht 61.0 in | Wt 156.8 lb

## 2022-03-14 DIAGNOSIS — M5416 Radiculopathy, lumbar region: Secondary | ICD-10-CM

## 2022-03-14 DIAGNOSIS — M545 Low back pain, unspecified: Secondary | ICD-10-CM

## 2022-03-14 MED ORDER — NAPROXEN 500 MG PO TABS
500.0000 mg | ORAL_TABLET | Freq: Two times a day (BID) | ORAL | 0 refills | Status: AC
  Filled 2022-03-14: qty 60, 30d supply, fill #0

## 2022-03-14 MED ORDER — METHOCARBAMOL 750 MG PO TABS
750.0000 mg | ORAL_TABLET | Freq: Three times a day (TID) | ORAL | 0 refills | Status: AC
  Filled 2022-03-14: qty 90, 30d supply, fill #0

## 2022-03-14 MED ORDER — GABAPENTIN 100 MG PO CAPS
100.0000 mg | ORAL_CAPSULE | Freq: Three times a day (TID) | ORAL | 0 refills | Status: DC
  Filled 2022-03-14: qty 90, 30d supply, fill #0

## 2022-03-14 NOTE — Patient Instructions (Addendum)
[name], it was a pleasure seeing you today! You endorsed feeling well today. Below are some of the things we talked about this visit. We look forward to seeing you in the follow up appointment!  Today we discussed: Left Knee Pain: It appears your knee pain is radiating down from your back. I would like to few more medicines to help with the pain and improve your function and refer you to physical therapy and give you exercises to do that will help with this pain as well.  Pain medicines include naproxen which is a stronger version of Aleve, muscle relaxer, and medicine to calm the nerve down. Gabapentin and Robaxin can cause some drowsiness.  So please start gabapentin first and then 3 days later start the Robaxin if you are able to tolerate the gabapentin. We can follow-up in 4 weeks to see if there is an improvement and if there is no improvement then we will order imaging.    I have ordered the following labs today:  Lab Orders  No laboratory test(s) ordered today      Referrals ordered today:   Referral Orders         Ambulatory referral to Physical Therapy       I have ordered the following medication/changed the following medications:   Stop the following medications: There are no discontinued medications.   Start the following medications: Meds ordered this encounter  Medications   naproxen (NAPROSYN) 500 MG tablet    Sig: Take 1 tablet (500 mg total) by mouth 2 (two) times daily with a meal.    Dispense:  60 tablet    Refill:  0    IM Program   gabapentin (NEURONTIN) 100 MG capsule    Sig: Take 1 capsule (100 mg total) by mouth 3 (three) times daily.    Dispense:  90 capsule    Refill:  0    IM Program   methocarbamol (ROBAXIN-750) 750 MG tablet    Sig: Take 1 tablet (750 mg total) by mouth 3 (three) times daily.    Dispense:  90 tablet    Refill:  0    IM Program     Follow-up: 1 month follow up.   Please make sure to arrive 15 minutes prior to your  next appointment. If you arrive late, you may be asked to reschedule.   We look forward to seeing you next time. Please call our clinic at [phone] if you have any questions or concerns. The best time to call is Monday-Friday from 9am-4pm, but there is someone available 24/7. If after hours or the weekend, call the main hospital number and ask for the Internal Medicine Resident On-Call. If you need medication refills, please notify your pharmacy one week in advance and they will send [place] a request.  Thank you for letting us take part in your care. Wishing you the best!  Thank you, Gwenevere Abbot, MD   [name], fue un placer verla hoy! Aprobaste sentirte bien hoy. A continuacin se presentan algunas de las cosas que hablamos sobre esta visita. Esperamos verlo en la cita de seguimiento!  Hoy discutimos: Dolor en la rodilla izquierda: parece que el dolor en la rodilla se irradia hacia abajo desde la espalda. Me gustara algunos medicamentos ms para ayudar con el dolor y mejorar su funcin y referirlo a fisioterapia y darle ejercicios para que lo ayuden con este dolor tambin. Los analgsicos incluyen naproxeno, que es una versin ms fuerte de  Aleve, relajantes musculares y medicamentos para calmar los nervios. Gabapentin y Robaxin pueden causar algo de somnolencia. Entonces, comience primero con gabapentina y [place], 3 das despus, comience con Robaxin si puede tolerar la gabapentina. Podemos hacer un seguimiento en 4 semanas para ver si hay una mejora y, si no hay mejora, ordenaremos imgenes.  He ordenado los siguientes laboratorios hoy:  rdenes de laboratorio No se ordenaron pruebas de laboratorio hoy     Referencias ordenadas hoy:  rdenes de referencia      Derivacin ambulatoria a Fisioterapia    He ordenado el siguiente medicamento/cambiado los siguientes medicamentos:  Suspender los siguientes medicamentos: No hay medicamentos descontinuados.  Iniciar los  siguientes medicamentos: Meds orden este encuentro medicamentos  tableta de 500 mg de naproxeno (NAPROSYN) Sig: Tome 1 tableta (500 mg en total) por va oral 2 (dos) veces al da con una comida. Dispensar: 60 tabletas recarga: 0 Programa de mensajera instantnea  cpsula de 100 mg de gabapentina (NEURONTIN) Sig: Tomar 1 cpsula (100 mg en total) por va oral 3 (tres) veces al [employer]. Dispensar: 90 cpsula recarga: 0 Programa de mensajera instantnea  tableta de 750 mg de metocarbamol (ROBAXIN-750) Sig: Tome 1 tableta (750 mg en total) por va oral 3 (tres) veces al [employer]. Dispensar: 90 tabletas recarga: 0 Programa de mensajera instantnea    Seguimiento: 1 mes de seguimiento.  Asegrese de llegar 15 minutos antes de su prxima cita. Si llega tarde, es posible que se Armed forces operational officer.  Esperamos verte la prxima vez. Llame a nuestra clnica al [phone] si tiene alguna pregunta o inquietud. El mejor horario para llamar es de lunes a viernes de 9 a. m. a 4 p. m., pero hay alguien disponible las 24 horas del da, los 7 [address]  [address] de la [place]. Si es fuera del horario de atencin o durante el fin de [place], llame al nmero principal del hospital y pregunte por el residente de guardia de medicina interna. Si necesita reposicin de medicamentos, por favor notifique a su farmacia con una semana de anticipacin y ellos nos enviarn una solicitud.  Gracias por dejarnos participar en su cuidado. Deseandote lo mejor!  Gelene Mink, MD

## 2022-03-14 NOTE — Progress Notes (Addendum)
   CC: knee pain/hip pain  HPI:  [name] is a 71 y.o. with medical history as below presenting to [facility] for acute knee pain/hip pain.   Please see problem-based list for further details, assessments, and plans.  Past Medical History:  Diagnosis Date   Diabetes mellitus without complication ([place])    DM type 2 (diabetes mellitus, type 2) ([place]) 2006   High cholesterol    Hyperlipidemia    Sarcoidosis of lung ([place]) 04/27/08   Followed by Dr. Lamonte Sakai, CT 09/01/2008 - bilateral patchy airspace disease, mild traction bronchiectasis with lower lobes,    hilar adenopathy, mediastinal adenopathy, approximately the same in degree is noted on 03/09/2008 CT   Review of Systems:  Review of system negative unless stated in the problem list or HPI.    Physical Exam:  Vitals:   03/14/22 1107  BP: 128/74  Pulse: 93  Temp: 98.2 F (36.8 C)  TempSrc: Oral  SpO2: 100%  Weight: 156 lb 12.8 oz (71.1 kg)    Physical Exam General: Well-developed, NAD HENT:  Lungs: CTAB, no wheeze, rhonchi or rales.  Cardiovascular: Normal heart sounds, no r/m/g, 2+ pulses in all extremities. No LE edema Abdomen: No TTP, normal bowel sounds MSK: slight swelling on right knee with slight TTP on medial aspect. No erythema present on knee. Has passive and active ROM without pain at knee and hip.  TTP on lumbar paraspinal muscles.  Skin: warm, dry good skin turgor, no lesions noted Neuro: Alert and oriented. CN grossly intact Psych: Normal mood and normal affect   Assessment & Plan:   See Encounters Tab for problem based charting.  Patient discussed with Dr. Garald Balding, MD

## 2022-03-15 NOTE — Assessment & Plan Note (Signed)
Patient presenting with hip pain radiating down the right leg into knee and down the calf. States pain is burning in quality and 6/10 when it is worst. It is worsened by activity and while supine. Denying any injuries or trauma. Initially stated this started 22 days ago but when I told her about previous visit in April she stated it was the same pain that had not resolved. She was able to walk 2 miles every other day but now her walking is limited. MRI done in 2015 L5-S1 stenosis with mild right and boderline left foraminal stenosis due to facet arthropathy, intervertebral spurring, and disc buldge. L5 nerve impingement. She states tylenol and aleve helps some but pain is still present. She states she did not try any exercises but willing to try them and would like PT referral. PE shows slight swelling on right knee with slight TTP on medial aspect. No erythema present. Has passive and active ROM. TTP on lumbar paraspinal muscles. Her symptoms consistent with Sciatica. Will start gabapentin 100 mg TID, Robaxin 750 mg TID, Naproxen 500 mg BID, and Tylenol 3g daily. Advised to wear knee brace to provide knee support as hip pain can alter gait. Low suspicion for acute knee pathology.   -Conservative treatment as above -Referral to PT -Follow up in one month  -If pain not improved, will escalate treatment including steroid injection.

## 2022-03-17 NOTE — Progress Notes (Signed)
Internal Medicine Clinic Attending  Case discussed with Dr. Khan  At the time of the visit.  We reviewed the resident's history and exam and pertinent patient test results.  I agree with the assessment, diagnosis, and plan of care documented in the resident's note.  

## 2022-04-04 ENCOUNTER — Other Ambulatory Visit (HOSPITAL_COMMUNITY): Payer: Self-pay

## 2022-05-01 ENCOUNTER — Other Ambulatory Visit (HOSPITAL_COMMUNITY): Payer: Self-pay

## 2022-05-06 ENCOUNTER — Other Ambulatory Visit (HOSPITAL_COMMUNITY): Payer: Self-pay

## 2022-05-06 ENCOUNTER — Other Ambulatory Visit: Payer: Self-pay | Admitting: Internal Medicine

## 2022-05-06 DIAGNOSIS — E782 Mixed hyperlipidemia: Secondary | ICD-10-CM

## 2022-05-06 MED ORDER — ROSUVASTATIN CALCIUM 40 MG PO TABS
40.0000 mg | ORAL_TABLET | Freq: Every day | ORAL | 2 refills | Status: DC
  Filled 2022-05-06: qty 30, 30d supply, fill #0
  Filled 2022-05-06: qty 90, 90d supply, fill #0
  Filled 2022-07-02: qty 30, 30d supply, fill #1
  Filled 2022-08-01: qty 30, 30d supply, fill #2
  Filled 2022-09-02: qty 30, 30d supply, fill #3
  Filled 2022-10-02: qty 30, 30d supply, fill #4
  Filled 2022-10-31: qty 30, 30d supply, fill #5
  Filled 2022-12-17: qty 30, 30d supply, fill #6
  Filled 2023-01-13: qty 30, 30d supply, fill #7
  Filled 2023-02-12 – 2023-03-02 (×3): qty 30, 30d supply, fill #8

## 2022-07-02 ENCOUNTER — Other Ambulatory Visit (HOSPITAL_COMMUNITY): Payer: Self-pay

## 2022-08-01 ENCOUNTER — Other Ambulatory Visit (HOSPITAL_COMMUNITY): Payer: Self-pay

## 2022-08-01 ENCOUNTER — Other Ambulatory Visit: Payer: Self-pay | Admitting: Student

## 2022-08-01 DIAGNOSIS — E113292 Type 2 diabetes mellitus with mild nonproliferative diabetic retinopathy without macular edema, left eye: Secondary | ICD-10-CM

## 2022-09-02 ENCOUNTER — Other Ambulatory Visit: Payer: Self-pay

## 2022-09-02 ENCOUNTER — Other Ambulatory Visit: Payer: Self-pay | Admitting: Internal Medicine

## 2022-09-02 ENCOUNTER — Encounter: Payer: Self-pay | Admitting: Student

## 2022-09-02 ENCOUNTER — Other Ambulatory Visit (HOSPITAL_COMMUNITY): Payer: Self-pay

## 2022-09-02 ENCOUNTER — Ambulatory Visit: Payer: Self-pay | Admitting: Student

## 2022-09-02 VITALS — BP 121/63 | HR 77 | Temp 98.0°F | Wt 158.6 lb

## 2022-09-02 DIAGNOSIS — E113292 Type 2 diabetes mellitus with mild nonproliferative diabetic retinopathy without macular edema, left eye: Secondary | ICD-10-CM

## 2022-09-02 DIAGNOSIS — Z794 Long term (current) use of insulin: Secondary | ICD-10-CM

## 2022-09-02 DIAGNOSIS — Z7984 Long term (current) use of oral hypoglycemic drugs: Secondary | ICD-10-CM

## 2022-09-02 DIAGNOSIS — Z1211 Encounter for screening for malignant neoplasm of colon: Secondary | ICD-10-CM

## 2022-09-02 DIAGNOSIS — Z87891 Personal history of nicotine dependence: Secondary | ICD-10-CM

## 2022-09-02 DIAGNOSIS — Z0000 Encounter for general adult medical examination without abnormal findings: Secondary | ICD-10-CM

## 2022-09-02 DIAGNOSIS — I10 Essential (primary) hypertension: Secondary | ICD-10-CM

## 2022-09-02 DIAGNOSIS — M5416 Radiculopathy, lumbar region: Secondary | ICD-10-CM

## 2022-09-02 DIAGNOSIS — E785 Hyperlipidemia, unspecified: Secondary | ICD-10-CM

## 2022-09-02 LAB — POCT GLYCOSYLATED HEMOGLOBIN (HGB A1C): Hemoglobin A1C: 7.2 % — AB (ref 4.0–5.6)

## 2022-09-02 LAB — GLUCOSE, CAPILLARY: Glucose-Capillary: 78 mg/dL (ref 70–99)

## 2022-09-02 MED ORDER — EZETIMIBE 10 MG PO TABS
10.0000 mg | ORAL_TABLET | Freq: Every day | ORAL | 11 refills | Status: DC
  Filled 2022-09-02: qty 30, 30d supply, fill #0
  Filled 2022-10-02: qty 30, 30d supply, fill #1
  Filled 2022-10-31: qty 30, 30d supply, fill #2
  Filled 2022-12-17: qty 30, 30d supply, fill #3
  Filled 2023-01-13: qty 30, 30d supply, fill #4
  Filled 2023-02-12 – 2023-03-02 (×3): qty 30, 30d supply, fill #5
  Filled 2023-03-18 – 2023-03-20 (×2): qty 30, 30d supply, fill #6

## 2022-09-02 MED ORDER — GABAPENTIN 100 MG PO CAPS
100.0000 mg | ORAL_CAPSULE | Freq: Three times a day (TID) | ORAL | 2 refills | Status: DC
  Filled 2022-09-02: qty 90, 30d supply, fill #0
  Filled 2022-10-02: qty 90, 30d supply, fill #1
  Filled 2022-10-31: qty 90, 30d supply, fill #2

## 2022-09-02 NOTE — Assessment & Plan Note (Signed)
-  Counsellor provided to patient -She is due for mammogram.  Resources provided to patient and family to call for assistant with mammogram. -Advised patient to go to [facility] or [facility] to ask for free flu shot -FOBT ordered for colon cancer screening

## 2022-09-02 NOTE — Assessment & Plan Note (Signed)
Pressure well-controlled 121/63.  Patient is not on any medications.

## 2022-09-02 NOTE — Assessment & Plan Note (Addendum)
A1c of 7.2, up from 6.37 months ago.  Patient report adherence to her canagliflozin-metformin.  Patient endorses an increased intake of starchy food such as tortillas and breads.  I encouraged patient to cut back on carbohydrates and increasing vegetables.  Patient agrees with the plan -Continue canagliflozin-metformin 50-1000 mg twice daily -Will place referral to ophthalmology and ordered urine microalbumin when patient obtains her orange card. -A1C in 6 months

## 2022-09-02 NOTE — Assessment & Plan Note (Signed)
Patient reports adherence to Crestor 40 mg but she only picked up her Zetia for 1 month in April.  Her last LDL was 130 with goal LDL < 100.  -Advised patient to pick up her Zetia and take 1 tablet daily -Continue Crestor 40 mg daily -Repeat lipid panel at next visit

## 2022-09-02 NOTE — Progress Notes (Signed)
   CC: Follow-up on diabetes  HPI:  [name] is a 71 y.o. with past medical history of type 2 diabetes, hyperlipidemia, pulmonary sarcoidosis who presents to the clinic today to follow-up on diabetes and hyperlipidemia.  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 Delton Coombes, CT 09/01/2008 - bilateral patchy airspace disease, mild traction bronchiectasis with lower lobes,    hilar adenopathy, mediastinal adenopathy, approximately the same in degree is noted on 03/09/2008 CT   Review of Systems:  per HPI  Physical Exam:  Vitals:   09/02/22 1521  BP: 121/63  Pulse: 77  Temp: 98 F (36.7 C)  TempSrc: Oral  SpO2: 97%  Weight: 158 lb 9.6 oz (71.9 kg)   Physical Exam Constitutional:      General: She is not in acute distress.    Appearance: She is not ill-appearing.  Eyes:     General:        Right eye: No discharge.        Left eye: No discharge.     Conjunctiva/sclera: Conjunctivae normal.  Cardiovascular:     Rate and Rhythm: Normal rate and regular rhythm.  Pulmonary:     Effort: Pulmonary effort is normal. No respiratory distress.     Breath sounds: Normal breath sounds. No wheezing.  Abdominal:     General: Bowel sounds are normal. There is no distension.     Palpations: Abdomen is soft.     Tenderness: There is no abdominal tenderness.  Musculoskeletal:        General: Normal range of motion.  Skin:    General: Skin is warm.  Neurological:     Mental Status: She is alert.  Psychiatric:        Mood and Affect: Mood normal.      Assessment & Plan:   See Encounters Tab for problem based charting.  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  Patient report adherence  to her canagliflozin-metformin.  Patient endorses an increased intake of starchy food such as tortillas and breads.  I encouraged patient to cut back on carbohydrates and increasing vegetables.  Patient agrees with the plan -Continue canagliflozin-metformin 50-1000 mg twice daily -Will place referral to ophthalmology and ordered urine microalbumin when patient obtains her orange card. -A1C in 6 months  Hyperlipidemia Patient reports adherence to Crestor 40 mg but she only picked up her Zetia for 1 month in April.  Her last LDL was 130 with goal LDL < 100.  -Advised patient to pick up her Zetia and take 1 tablet daily -Continue Crestor 40 mg daily -Repeat lipid panel at next visit  HTN (hypertension) Pressure well-controlled 121/63.  Patient is not on any medications.  Healthcare maintenance -Counsellor provided to patient -She is due for mammogram.  Resources provided to patient and family to call for assistant with mammogram. -Advised patient to go to [facility] or [facility] to ask for free flu shot -FOBT ordered for colon cancer screening  Lumbar radiculopathy Refill gabapentin   Patient discussed with Dr. Heide Spark

## 2022-09-02 NOTE — Assessment & Plan Note (Signed)
Refill gabapentin 

## 2022-09-02 NOTE — Patient Instructions (Addendum)
[name],  Fue agradable verte hoy en la clnica. Aqu tenis un resumen de lo que hablamos:  1. Tu presin arterial est muy bien controlada.  2. Su A1c es 7,2 hoy, que es ms alto que la ltima visita. Contine tomando canagliflozina-metformina 1 tableta dos veces al da. Reduzca el consumo de alimentos con almidn como pan, arroz o tortillas. Despus de que obtenga el auto naranja, lo derivar a un oftalmlogo para un chequeo ocular anual para diabticos.  3. Tome Zetia 10 mg y tome 1 tableta al da para su colesterol. Contine con Crestor 40 mg al da.  4. Llame al nmero proporcionado para programar una mamografa.  5. [facility] a [facility] o [facility] y pregunte sobre la vacuna contra la gripe gratuita.  6. Utilice el kit que se le proporcion para recolectar la muestra de heces para la deteccin del cncer de colon.  Por favor regresa en 6 meses  It was nice seeing you in the clinic today.  Here is a summary what we talked about:  1.  Your blood pressure is very well-controlled.  2.  Your A1c is 7.2 today, which is higher than last visit.  Please continue the canagliflozin-metformin 1 tablet twice daily.  Please cut back on starchy foods such as breads, rice or tortillas.  After you get the orange car, I will place a referral to an ophthalmologist for annual diabetic eye checkup.  3.  Please pick up your Zetia 10 mg and take 1 tablet daily for your cholesterol.  Please continue Crestor 40 mg daily.  4.  Please call the number provided to schedule a mammogram.  5.  [facility] and ask about free flu shot.  6.  Please use the kit provided to you to collect the stool sample for colon cancer screening.  Please return in 6 months  Take care,  Dr. Nguyen 

## 2022-09-03 ENCOUNTER — Other Ambulatory Visit: Payer: Self-pay | Admitting: Student

## 2022-09-03 ENCOUNTER — Other Ambulatory Visit (HOSPITAL_COMMUNITY): Payer: Self-pay

## 2022-09-03 DIAGNOSIS — E113292 Type 2 diabetes mellitus with mild nonproliferative diabetic retinopathy without macular edema, left eye: Secondary | ICD-10-CM

## 2022-09-03 NOTE — Progress Notes (Signed)
Internal Medicine Clinic Attending  Case discussed with Dr. Nguyen  At the time of the visit.  We reviewed the resident's history and exam and pertinent patient test results.  I agree with the assessment, diagnosis, and plan of care documented in the resident's note. 

## 2022-10-02 ENCOUNTER — Other Ambulatory Visit: Payer: Self-pay | Admitting: Student

## 2022-10-02 ENCOUNTER — Other Ambulatory Visit (HOSPITAL_COMMUNITY): Payer: Self-pay

## 2022-10-02 DIAGNOSIS — E113292 Type 2 diabetes mellitus with mild nonproliferative diabetic retinopathy without macular edema, left eye: Secondary | ICD-10-CM

## 2022-10-02 MED FILL — Canagliflozin-Metformin HCl Tab 50-1000 MG: ORAL | 30 days supply | Qty: 60 | Fill #0 | Status: AC

## 2022-10-03 ENCOUNTER — Other Ambulatory Visit (HOSPITAL_COMMUNITY): Payer: Self-pay

## 2022-10-03 ENCOUNTER — Other Ambulatory Visit: Payer: Self-pay

## 2022-10-31 ENCOUNTER — Other Ambulatory Visit: Payer: Self-pay

## 2022-10-31 ENCOUNTER — Other Ambulatory Visit (HOSPITAL_COMMUNITY): Payer: Self-pay

## 2022-10-31 MED FILL — Canagliflozin-Metformin HCl Tab 50-1000 MG: ORAL | 30 days supply | Qty: 60 | Fill #1 | Status: AC

## 2022-12-01 ENCOUNTER — Other Ambulatory Visit (HOSPITAL_COMMUNITY): Payer: Self-pay

## 2022-12-01 MED FILL — Canagliflozin-Metformin HCl Tab 50-1000 MG: ORAL | 30 days supply | Qty: 60 | Fill #2 | Status: AC

## 2022-12-17 ENCOUNTER — Other Ambulatory Visit (HOSPITAL_COMMUNITY): Payer: Self-pay

## 2022-12-17 ENCOUNTER — Other Ambulatory Visit: Payer: Self-pay | Admitting: Student

## 2022-12-17 DIAGNOSIS — M5416 Radiculopathy, lumbar region: Secondary | ICD-10-CM

## 2022-12-17 MED ORDER — GABAPENTIN 100 MG PO CAPS
100.0000 mg | ORAL_CAPSULE | Freq: Three times a day (TID) | ORAL | 2 refills | Status: DC
  Filled 2022-12-17 – 2022-12-18 (×2): qty 90, 30d supply, fill #0
  Filled 2023-01-13: qty 90, 30d supply, fill #1
  Filled 2023-02-12 – 2023-02-24 (×2): qty 90, 30d supply, fill #2
  Filled 2023-03-02: qty 90, 30d supply, fill #0

## 2022-12-17 MED FILL — Canagliflozin-Metformin HCl Tab 50-1000 MG: ORAL | 30 days supply | Qty: 60 | Fill #3 | Status: CN

## 2022-12-18 ENCOUNTER — Other Ambulatory Visit (HOSPITAL_COMMUNITY): Payer: Self-pay

## 2022-12-18 MED FILL — Canagliflozin-Metformin HCl Tab 50-1000 MG: ORAL | 30 days supply | Qty: 60 | Fill #3 | Status: AC

## 2022-12-19 ENCOUNTER — Other Ambulatory Visit (HOSPITAL_COMMUNITY): Payer: Self-pay

## 2022-12-19 ENCOUNTER — Other Ambulatory Visit: Payer: Self-pay

## 2022-12-22 ENCOUNTER — Other Ambulatory Visit (HOSPITAL_COMMUNITY): Payer: Self-pay

## 2022-12-26 ENCOUNTER — Other Ambulatory Visit (HOSPITAL_COMMUNITY): Payer: Self-pay

## 2023-01-13 ENCOUNTER — Other Ambulatory Visit: Payer: Self-pay

## 2023-01-13 MED FILL — Canagliflozin-Metformin HCl Tab 50-1000 MG: ORAL | 30 days supply | Qty: 60 | Fill #4 | Status: AC

## 2023-02-12 ENCOUNTER — Other Ambulatory Visit (HOSPITAL_COMMUNITY): Payer: Self-pay

## 2023-02-12 ENCOUNTER — Other Ambulatory Visit: Payer: Self-pay

## 2023-02-12 ENCOUNTER — Encounter: Payer: Self-pay | Admitting: Pharmacist

## 2023-02-12 MED FILL — Canagliflozin-Metformin HCl Tab 50-1000 MG: ORAL | 30 days supply | Qty: 60 | Fill #5 | Status: CN

## 2023-02-13 ENCOUNTER — Other Ambulatory Visit (HOSPITAL_COMMUNITY): Payer: Self-pay

## 2023-02-16 ENCOUNTER — Other Ambulatory Visit (HOSPITAL_COMMUNITY): Payer: Self-pay

## 2023-02-17 ENCOUNTER — Other Ambulatory Visit: Payer: Self-pay

## 2023-02-24 ENCOUNTER — Encounter: Payer: Self-pay | Admitting: Pharmacist

## 2023-02-24 ENCOUNTER — Other Ambulatory Visit: Payer: Self-pay

## 2023-02-24 ENCOUNTER — Other Ambulatory Visit (HOSPITAL_COMMUNITY): Payer: Self-pay

## 2023-02-24 MED FILL — Canagliflozin-Metformin HCl Tab 50-1000 MG: ORAL | 30 days supply | Qty: 60 | Fill #5 | Status: CN

## 2023-02-26 ENCOUNTER — Other Ambulatory Visit (HOSPITAL_COMMUNITY): Payer: Self-pay

## 2023-03-02 ENCOUNTER — Other Ambulatory Visit (HOSPITAL_COMMUNITY): Payer: Self-pay

## 2023-03-02 ENCOUNTER — Other Ambulatory Visit: Payer: Self-pay

## 2023-03-02 MED FILL — Canagliflozin-Metformin HCl Tab 50-1000 MG: ORAL | 30 days supply | Qty: 60 | Fill #5 | Status: AC

## 2023-03-03 ENCOUNTER — Other Ambulatory Visit (HOSPITAL_COMMUNITY): Payer: Self-pay

## 2023-03-09 ENCOUNTER — Encounter: Payer: Self-pay | Admitting: *Deleted

## 2023-03-18 ENCOUNTER — Other Ambulatory Visit: Payer: Self-pay | Admitting: Student

## 2023-03-18 ENCOUNTER — Other Ambulatory Visit (HOSPITAL_COMMUNITY): Payer: Self-pay

## 2023-03-18 DIAGNOSIS — E782 Mixed hyperlipidemia: Secondary | ICD-10-CM

## 2023-03-18 DIAGNOSIS — M5416 Radiculopathy, lumbar region: Secondary | ICD-10-CM

## 2023-03-18 MED FILL — Canagliflozin-Metformin HCl Tab 50-1000 MG: ORAL | 30 days supply | Qty: 60 | Fill #6 | Status: CN

## 2023-03-19 ENCOUNTER — Other Ambulatory Visit (HOSPITAL_COMMUNITY): Payer: Self-pay

## 2023-03-19 ENCOUNTER — Other Ambulatory Visit: Payer: Self-pay

## 2023-03-19 MED ORDER — GABAPENTIN 100 MG PO CAPS
100.0000 mg | ORAL_CAPSULE | Freq: Three times a day (TID) | ORAL | 2 refills | Status: DC
Start: 2023-03-19 — End: 2023-03-20
  Filled 2023-03-19 – 2023-03-20 (×2): qty 90, 30d supply, fill #0

## 2023-03-19 MED ORDER — ROSUVASTATIN CALCIUM 40 MG PO TABS
40.0000 mg | ORAL_TABLET | Freq: Every day | ORAL | 2 refills | Status: DC
Start: 2023-03-19 — End: 2023-03-20
  Filled 2023-03-19 – 2023-03-20 (×2): qty 90, 90d supply, fill #0

## 2023-03-19 NOTE — Telephone Encounter (Signed)
Next appt scheduled tomorrow 6/7 with Dr Austin Miles.

## 2023-03-20 ENCOUNTER — Encounter: Payer: Self-pay | Admitting: Student

## 2023-03-20 ENCOUNTER — Other Ambulatory Visit (HOSPITAL_COMMUNITY): Payer: Self-pay

## 2023-03-20 ENCOUNTER — Other Ambulatory Visit: Payer: Self-pay | Admitting: Student

## 2023-03-20 ENCOUNTER — Ambulatory Visit: Payer: Self-pay | Admitting: Student

## 2023-03-20 VITALS — BP 117/67 | HR 88 | Temp 98.2°F | Ht 61.0 in | Wt 164.7 lb

## 2023-03-20 DIAGNOSIS — E0841 Diabetes mellitus due to underlying condition with diabetic mononeuropathy: Secondary | ICD-10-CM

## 2023-03-20 DIAGNOSIS — E113292 Type 2 diabetes mellitus with mild nonproliferative diabetic retinopathy without macular edema, left eye: Secondary | ICD-10-CM

## 2023-03-20 DIAGNOSIS — E782 Mixed hyperlipidemia: Secondary | ICD-10-CM

## 2023-03-20 DIAGNOSIS — I10 Essential (primary) hypertension: Secondary | ICD-10-CM

## 2023-03-20 DIAGNOSIS — E1140 Type 2 diabetes mellitus with diabetic neuropathy, unspecified: Secondary | ICD-10-CM

## 2023-03-20 DIAGNOSIS — D869 Sarcoidosis, unspecified: Secondary | ICD-10-CM

## 2023-03-20 DIAGNOSIS — Z7984 Long term (current) use of oral hypoglycemic drugs: Secondary | ICD-10-CM

## 2023-03-20 DIAGNOSIS — M5416 Radiculopathy, lumbar region: Secondary | ICD-10-CM

## 2023-03-20 DIAGNOSIS — E785 Hyperlipidemia, unspecified: Secondary | ICD-10-CM

## 2023-03-20 LAB — POCT GLYCOSYLATED HEMOGLOBIN (HGB A1C): Hemoglobin A1C: 7.7 % — AB (ref 4.0–5.6)

## 2023-03-20 LAB — GLUCOSE, CAPILLARY: Glucose-Capillary: 180 mg/dL — ABNORMAL HIGH (ref 70–99)

## 2023-03-20 MED ORDER — GABAPENTIN 100 MG PO CAPS: 100.0000 mg | ORAL_CAPSULE | Freq: Three times a day (TID) | ORAL | 2 refills | Status: DC

## 2023-03-20 MED ORDER — INVOKAMET 50-1000 MG PO TABS: 1.0000 | ORAL_TABLET | Freq: Two times a day (BID) | ORAL | 3 refills | Status: DC

## 2023-03-20 MED ORDER — EZETIMIBE 10 MG PO TABS
10.0000 mg | ORAL_TABLET | Freq: Every day | ORAL | 3 refills | Status: AC
Start: 2023-03-20 — End: 2024-03-19
  Filled 2023-03-20: qty 90, 90d supply, fill #0
  Filled 2023-04-24 – 2023-06-01 (×3): qty 90, 90d supply, fill #1
  Filled 2023-06-26: qty 90, 90d supply, fill #2
  Filled 2023-07-01: qty 90, 90d supply, fill #0
  Filled 2023-08-25: qty 30, 30d supply, fill #0
  Filled 2023-09-28: qty 90, 90d supply, fill #0
  Filled 2023-11-03: qty 90, 90d supply, fill #1

## 2023-03-20 MED ORDER — ROSUVASTATIN CALCIUM 40 MG PO TABS
40.0000 mg | ORAL_TABLET | Freq: Every day | ORAL | 2 refills | Status: DC
Start: 2023-03-20 — End: 2024-02-23
  Filled 2023-03-21 – 2023-09-28 (×3): qty 30, 30d supply, fill #0

## 2023-03-20 MED FILL — Canagliflozin-Metformin HCl Tab 50-1000 MG: ORAL | 30 days supply | Qty: 60 | Fill #6 | Status: CN

## 2023-03-20 NOTE — Progress Notes (Signed)
   CC: f/u T2DM, HLD, sarcoidosis  HPI:  [name] is a 72 y.o. female with history listed below presenting to the [facility] for f/u T2DM, HLD, sarcoidosis. Please see individualized problem based charting for full HPI.  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 Delton Coombes, CT 09/01/2008 - bilateral patchy airspace disease, mild traction bronchiectasis with lower lobes,    hilar adenopathy, mediastinal adenopathy, approximately the same in degree is noted on 03/09/2008 CT    Review of Systems:  Negative aside from that listed in individualized problem based charting.  Physical Exam:  Vitals:   03/20/23 1048  BP: 117/67  Pulse: 88  Temp: 98.2 F (36.8 C)  TempSrc: Oral  SpO2: 98%  Weight: 164 lb 11.2 oz (74.7 kg)  Height: 5\' 1"  (1.549 m)   Physical Exam Constitutional:      Appearance: She is obese. She is not ill-appearing.  HENT:     Mouth/Throat:     Mouth: Mucous membranes are moist.     Pharynx: Oropharynx is clear. No oropharyngeal exudate.  Eyes:     General: No scleral icterus.    Extraocular Movements: Extraocular movements intact.     Conjunctiva/sclera: Conjunctivae normal.     Pupils: Pupils are equal, round, and reactive to light.  Cardiovascular:     Rate and Rhythm: Normal rate and regular rhythm.     Pulses: Normal pulses.     Heart sounds: Normal heart sounds. No murmur heard.    No friction rub. No gallop.  Pulmonary:     Effort: Pulmonary effort is normal.     Breath sounds: Normal breath sounds. No wheezing, rhonchi or rales.  Abdominal:     General: Bowel sounds are normal. There is no distension.     Palpations: Abdomen is soft.     Tenderness: There is no abdominal tenderness. There is no guarding or rebound.  Musculoskeletal:        General: No swelling.  Normal range of motion.  Skin:    General: Skin is warm and dry.  Neurological:     General: No focal deficit present.     Mental Status: She is alert and oriented to person, place, and time.  Psychiatric:        Mood and Affect: Mood normal.        Behavior: Behavior normal.      Assessment & Plan:   See Encounters Tab for problem based charting.  Patient discussed with Dr. Antony Contras

## 2023-03-20 NOTE — Assessment & Plan Note (Signed)
Stable and in remission. She denies any SHOB, cough, wheezing, or other respiratory issues. She is highly functional without any difficulties. Checking BMP today and will ensure calcium is at goal.

## 2023-03-20 NOTE — Assessment & Plan Note (Signed)
BP 117/67 in clinic today, well controlled. Not on any medications.

## 2023-03-20 NOTE — Assessment & Plan Note (Signed)
Having good benefit from gabapentin therapy, refilled today.

## 2023-03-20 NOTE — Assessment & Plan Note (Addendum)
A1c 7.7% today, up from 7.2% about 6 months ago. She is on canagliflozin-metformin 50-1000mg  BID and tolerating this well. Given her advanced age, comfortable with less stringent goal of A1c <8%. She did see her ophthalmologist about 2 months ago with stable findings per patient. I have requested for them to have these faxed to our clinic. She is uninsured, but okay with obtaining BMP today (last one over a year ago) to ensure her kidney function is stable. Foot exam performed today.  Plan: -continue canagliflozin-metformin 50-1000mg  BID -foot exam completed today -f/u BMP -patient to have eye exam results faxed to our clinic -f/u in 3 months for repeat A1c (goal <8%)

## 2023-03-20 NOTE — Assessment & Plan Note (Signed)
On crestor 40mg  daily and zetia 10mg  daily. LDL was 130 last year prior to zetia addition. Repeating lipid panel today to reassess LDL level.

## 2023-03-20 NOTE — Patient Instructions (Addendum)
[name],  Ha sido un placer verte hoy en la clnica.   1. He vuelto a surtir sus medicamentos y los he enviado a Psychologist, counselling. 2. Completamos su examen de pies hoy. 3. Hoy estamos [employer]. Te llamar con [employer]. 4. Por favor, vuelva a vernos en 3 meses.  Llame a nuestra clnica al [phone] si tiene alguna pregunta o inquietud. El mejor momento para llamar es de lunes a viernes de 9 a.m. a 4 p.m., pero hay alguien disponible las 24 horas del da, los 7 das de la semana en el mismo nmero. Si necesita resurtir [employer], notifique a su farmacia con una semana de anticipacin y nos enviarn una solicitud.   Gracias por permitirnos participar en su atencin. Esperamos verte la prxima!

## 2023-03-21 ENCOUNTER — Other Ambulatory Visit (HOSPITAL_COMMUNITY): Payer: Self-pay

## 2023-03-21 LAB — BMP8+ANION GAP
Anion Gap: 13 mmol/L (ref 10.0–18.0)
BUN/Creatinine Ratio: 33 — ABNORMAL HIGH (ref 12–28)
BUN: 19 mg/dL (ref 8–27)
CO2: 25 mmol/L (ref 20–29)
Calcium: 10.1 mg/dL (ref 8.7–10.3)
Chloride: 100 mmol/L (ref 96–106)
Creatinine, Ser: 0.58 mg/dL (ref 0.57–1.00)
Glucose: 130 mg/dL — ABNORMAL HIGH (ref 70–99)
Potassium: 4.6 mmol/L (ref 3.5–5.2)
Sodium: 138 mmol/L (ref 134–144)
eGFR: 97 mL/min/{1.73_m2} (ref >59)

## 2023-03-21 LAB — LIPID PANEL
Chol/HDL Ratio: 2.6 ratio (ref 0.0–4.4)
Cholesterol, Total: 149 mg/dL (ref 100–199)
HDL: 58 mg/dL (ref >39)
LDL Chol Calc (NIH): 74 mg/dL (ref 0–99)
Triglycerides: 92 mg/dL (ref 0–149)
VLDL Cholesterol Cal: 17 mg/dL (ref 5–40)

## 2023-03-23 ENCOUNTER — Other Ambulatory Visit (HOSPITAL_COMMUNITY): Payer: Self-pay

## 2023-03-23 ENCOUNTER — Other Ambulatory Visit: Payer: Self-pay

## 2023-03-23 MED ORDER — GABAPENTIN 100 MG PO CAPS
100.0000 mg | ORAL_CAPSULE | Freq: Three times a day (TID) | ORAL | 2 refills | Status: DC
Start: 2023-03-23 — End: 2024-02-23
  Filled 2023-03-23 – 2023-03-25 (×3): qty 90, 30d supply, fill #0
  Filled 2023-04-24: qty 90, 30d supply, fill #1
  Filled 2023-04-24: qty 90, 30d supply, fill #0
  Filled 2023-06-01 (×2): qty 90, 30d supply, fill #1

## 2023-03-23 MED ORDER — INVOKAMET 50-1000 MG PO TABS
1.0000 | ORAL_TABLET | Freq: Two times a day (BID) | ORAL | 3 refills | Status: AC
Start: 2023-03-23 — End: 2024-03-17
  Filled 2023-03-23: qty 60, 30d supply, fill #0
  Filled 2023-03-23: qty 180, 90d supply, fill #0
  Filled 2023-04-24: qty 60, 30d supply, fill #0
  Filled 2023-04-24 – 2023-06-01 (×3): qty 60, 30d supply, fill #1
  Filled 2023-06-26: qty 60, 30d supply, fill #2
  Filled 2023-07-01 – 2023-07-30 (×3): qty 60, 30d supply, fill #0

## 2023-03-23 MED ORDER — ROSUVASTATIN CALCIUM 40 MG PO TABS
40.0000 mg | ORAL_TABLET | Freq: Every day | ORAL | 2 refills | Status: DC
Start: 2023-03-23 — End: 2024-02-23
  Filled 2023-03-23: qty 30, 30d supply, fill #0
  Filled 2023-03-23: qty 90, 90d supply, fill #0
  Filled 2023-04-24: qty 30, 30d supply, fill #0
  Filled 2023-04-24 – 2023-06-01 (×3): qty 30, 30d supply, fill #1
  Filled 2023-06-26: qty 30, 30d supply, fill #2
  Filled 2023-07-01 (×2): qty 30, 30d supply, fill #0
  Filled 2023-11-03: qty 30, 30d supply, fill #1

## 2023-03-24 ENCOUNTER — Other Ambulatory Visit (HOSPITAL_COMMUNITY): Payer: Self-pay

## 2023-03-24 NOTE — Progress Notes (Signed)
Internal Medicine Clinic Attending  Case discussed with Dr. Jinwala  At the time of the visit.  We reviewed the resident's history and exam and pertinent patient test results.  I agree with the assessment, diagnosis, and plan of care documented in the resident's note.  

## 2023-03-25 ENCOUNTER — Other Ambulatory Visit (HOSPITAL_COMMUNITY): Payer: Self-pay

## 2023-04-24 ENCOUNTER — Other Ambulatory Visit: Payer: Self-pay

## 2023-04-24 ENCOUNTER — Other Ambulatory Visit (HOSPITAL_COMMUNITY): Payer: Self-pay

## 2023-06-01 ENCOUNTER — Other Ambulatory Visit (HOSPITAL_COMMUNITY): Payer: Self-pay

## 2023-06-01 ENCOUNTER — Other Ambulatory Visit: Payer: Self-pay

## 2023-06-26 ENCOUNTER — Other Ambulatory Visit (HOSPITAL_COMMUNITY): Payer: Self-pay

## 2023-06-30 ENCOUNTER — Other Ambulatory Visit (HOSPITAL_COMMUNITY): Payer: Self-pay

## 2023-07-01 ENCOUNTER — Telehealth: Payer: Self-pay

## 2023-07-01 ENCOUNTER — Other Ambulatory Visit: Payer: Self-pay | Admitting: Student

## 2023-07-01 ENCOUNTER — Other Ambulatory Visit: Payer: Self-pay

## 2023-07-01 ENCOUNTER — Other Ambulatory Visit (HOSPITAL_COMMUNITY): Payer: Self-pay

## 2023-07-01 DIAGNOSIS — E113292 Type 2 diabetes mellitus with mild nonproliferative diabetic retinopathy without macular edema, left eye: Secondary | ICD-10-CM

## 2023-07-01 MED ORDER — METFORMIN HCL 1000 MG PO TABS
1000.0000 mg | ORAL_TABLET | Freq: Two times a day (BID) | ORAL | 6 refills | Status: DC
Start: 2023-07-01 — End: 2024-02-23
  Filled 2023-07-01: qty 60, 30d supply, fill #0

## 2023-07-01 MED ORDER — EZETIMIBE 10 MG PO TABS
10.0000 mg | ORAL_TABLET | Freq: Every day | ORAL | 3 refills | Status: DC
  Filled 2023-07-01: qty 90, 90d supply, fill #0

## 2023-07-01 NOTE — Telephone Encounter (Signed)
ERROR

## 2023-07-01 NOTE — Telephone Encounter (Signed)
Forwarded JJPAF app to pharmacist Corena Herter for quicker completion at the pharmacy.

## 2023-07-01 NOTE — Telephone Encounter (Signed)
Medication no longer on IM program.   Switching to PAP (Patient Assistance Program).   Mailing JJPAF ([employer]) application to patients home for [facility] assistance.  In the meantime, provider was able to send metformin to pharmacy and give patient Jardiance samples to bridge gap while assistance is pending.   Medication Samples have been provided to the patient.  Drug name: JARDIANCE       Strength: 10MG         Qty: 30  LOT: [id number]  Exp.Date: 12/2024  Dosing instructions: TAKE 1 DAILY   [name] 3:48 PM 07/01/2023

## 2023-07-28 NOTE — Telephone Encounter (Signed)
Patients daugther Archie Patten called to f/u on application.   (Forgot to document that application was placed in providers box for signature on 07/08/23 - pt's portion completed. Will f/u on app at office asap).  Patient is almost out of Jardiance samples but also does not like how medication makes her feel and prefers her invokamet. I've reached out to [place] at [facility] to see if she could get a bridge supply of Invokamet while pap is still processing.

## 2023-07-29 ENCOUNTER — Other Ambulatory Visit (HOSPITAL_COMMUNITY): Payer: Self-pay

## 2023-07-29 NOTE — Telephone Encounter (Signed)
Submitted application for INVOKAMET to JJPAF [employer]) for patient assistance.   Phone: [phone]

## 2023-07-30 ENCOUNTER — Other Ambulatory Visit (HOSPITAL_COMMUNITY): Payer: Self-pay

## 2023-08-06 NOTE — Telephone Encounter (Signed)
Received notification from [facility] (NOT JJPAF) regarding approval for [facility]. Patient assistance approved from 07/30/23 to 07/29/24.  Medication will ship to PATIENTS HOME  Pt ID: UNKNOWN  Company phone: [phone]

## 2023-08-25 ENCOUNTER — Other Ambulatory Visit: Payer: Self-pay

## 2023-08-25 ENCOUNTER — Other Ambulatory Visit (HOSPITAL_COMMUNITY): Payer: Self-pay

## 2023-08-26 ENCOUNTER — Other Ambulatory Visit (HOSPITAL_COMMUNITY): Payer: Self-pay

## 2023-08-31 ENCOUNTER — Other Ambulatory Visit: Payer: Self-pay

## 2023-09-28 ENCOUNTER — Other Ambulatory Visit (HOSPITAL_COMMUNITY): Payer: Self-pay

## 2023-11-03 ENCOUNTER — Other Ambulatory Visit: Payer: Self-pay

## 2023-11-17 ENCOUNTER — Other Ambulatory Visit (HOSPITAL_COMMUNITY): Payer: Self-pay

## 2024-02-22 NOTE — Progress Notes (Unsigned)
 CC: follow up  HPI:  [name] is a 73 y.o. with medical history of HTN, HLD, DMII and sarcoidosis presenting to [facility] for a follow up appointment. Last seen one year ago and recommend a 3 month follow up at that time.   Please see problem-based list for further details, assessments, and plans.  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 Baldwin Levee, CT 09/01/2008 - bilateral patchy airspace disease, mild traction bronchiectasis with lower lobes,    hilar adenopathy, mediastinal adenopathy, approximately the same in degree is noted on 03/09/2008 CT    Current Outpatient Medications (Endocrine & Metabolic):    Canagliflozin -metFORMIN  HCl (INVOKAMET ) 50-1000 MG TABS, Take 1 tablet by mouth 2 (two) times daily.   Canagliflozin -metFORMIN  HCl (INVOKAMET ) 50-1000 MG TABS, Take 1 tablet by mouth 2 (two) times daily.   metFORMIN  (GLUCOPHAGE ) 1000 MG tablet, Take 1 tablet (1,000 mg total) by mouth 2 (two) times daily with a meal.  Current Outpatient Medications (Cardiovascular):    ezetimibe  (ZETIA ) 10 MG tablet, Take 1 tablet (10 mg total) by mouth daily.   rosuvastatin  (CRESTOR ) 40 MG tablet, Take 1 tablet (40 mg total) by mouth daily.   rosuvastatin  (CRESTOR ) 40 MG tablet, Take 1 tablet (40 mg total) by mouth daily.     Current Outpatient Medications (Other):    Blood Glucose Monitoring Suppl (BLOOD GLUCOSE METER KIT AND SUPPLIES), Check blood sugar up to 1 time a day as instructed   gabapentin  (NEURONTIN ) 100 MG capsule, Take 1 capsule (100 mg total) by mouth 3 (three) times daily.   gabapentin  (NEURONTIN ) 100 MG capsule, Take 1 capsule (100 mg total) by mouth 3 (three) times daily.   glucose blood (WAVESENSE PRESTO) test strip, Check blood sugar as instructed up to 1 time a day   Insulin  Pen Needle  (UNIFINE PENTIPS) 32G X 4 MM MISC, Use as directed in the morning and at bedtime.   Lancets 30G MISC, .check blood sugar up to 1 time a day as instructred  Review of Systems:  Review of system negative unless stated in the problem list or HPI.    Physical Exam:  There were no vitals filed for this visit. Physical Exam General: NAD HENT: NCAT Lungs: CTAB, no wheeze, rhonchi or rales.  Cardiovascular: Normal heart sounds, no r/m/g, 2+ pulses in all extremities. No LE edema Abdomen: No TTP, normal bowel sounds MSK: No asymmetry or muscle atrophy.  Skin: no lesions noted on exposed skin Neuro: Alert and oriented x4. CN grossly intact Psych: Normal mood and normal affect   Assessment & Plan:   No problem-specific Assessment & Plan notes found for this encounter.   See Encounters Tab for problem based charting.  Patient Discussed with Dr. {NAMES:[id number]::"Guilloud","Hoffman","Mullen","Narendra","Vincent","Machen","Lau","Hatcher","Williams"} Jackolyn Masker, MD Tommas Fragmin. [facility] Internal Medicine Residency, PGY-3   HTN  DMII Invokamet  50-1000 mg BID. A1c this visit.  HLD On crestor  40 mg every day. LDL in 2023 was 74.  The 10-year ASCVD risk score (Arnett DK, et al., 2019) is: 20%   Values used to calculate the score:     Age: 57 years     Sex: Female     Is Non-Hispanic African American: No     Diabetic: Yes     Tobacco smoker: No  Systolic Blood Pressure: 117 mmHg     Is BP treated: Yes     HDL Cholesterol: 58 mg/dL     Total Cholesterol: 149 mg/dL     Care Gaps ACR Colonoscopy MM   BMP, lipid panel, A1c

## 2024-02-23 ENCOUNTER — Other Ambulatory Visit (HOSPITAL_COMMUNITY): Payer: Self-pay

## 2024-02-23 ENCOUNTER — Ambulatory Visit (HOSPITAL_COMMUNITY)
Admission: RE | Admit: 2024-02-23 | Discharge: 2024-02-23 | Disposition: A | Discharge status: Home / Self Care | Source: Ambulatory Visit | Attending: Internal Medicine | Admitting: Internal Medicine

## 2024-02-23 ENCOUNTER — Ambulatory Visit: Payer: Self-pay | Admitting: Internal Medicine

## 2024-02-23 ENCOUNTER — Other Ambulatory Visit: Payer: Self-pay

## 2024-02-23 ENCOUNTER — Encounter: Payer: Self-pay | Admitting: Internal Medicine

## 2024-02-23 VITALS — BP 129/70 | HR 89 | Temp 97.7°F | Ht 61.0 in | Wt 162.6 lb

## 2024-02-23 DIAGNOSIS — D869 Sarcoidosis, unspecified: Secondary | ICD-10-CM

## 2024-02-23 DIAGNOSIS — E785 Hyperlipidemia, unspecified: Secondary | ICD-10-CM

## 2024-02-23 DIAGNOSIS — E559 Vitamin D deficiency, unspecified: Secondary | ICD-10-CM

## 2024-02-23 DIAGNOSIS — I10 Essential (primary) hypertension: Secondary | ICD-10-CM

## 2024-02-23 DIAGNOSIS — E113292 Type 2 diabetes mellitus with mild nonproliferative diabetic retinopathy without macular edema, left eye: Secondary | ICD-10-CM

## 2024-02-23 DIAGNOSIS — E782 Mixed hyperlipidemia: Secondary | ICD-10-CM

## 2024-02-23 DIAGNOSIS — Z1211 Encounter for screening for malignant neoplasm of colon: Secondary | ICD-10-CM

## 2024-02-23 DIAGNOSIS — Z0000 Encounter for general adult medical examination without abnormal findings: Secondary | ICD-10-CM

## 2024-02-23 LAB — POCT GLYCOSYLATED HEMOGLOBIN (HGB A1C): Hemoglobin A1C: 8.4 % — AB (ref 4.0–5.6)

## 2024-02-23 LAB — GLUCOSE, CAPILLARY: Glucose-Capillary: 137 mg/dL — ABNORMAL HIGH (ref 70–99)

## 2024-02-23 MED ORDER — ROSUVASTATIN CALCIUM 40 MG PO TABS
40.0000 mg | ORAL_TABLET | Freq: Every day | ORAL | 2 refills | Status: AC
Start: 2024-02-23 — End: ?
  Filled 2024-02-23: qty 30, 30d supply, fill #0

## 2024-02-23 NOTE — Patient Instructions (Addendum)
 [name], it was a pleasure seeing you today! You endorsed feeling well today. Below are some of the things we talked about this visit. We look forward to seeing you in the follow up appointment!  Today we discussed: Continue taking your medications.   Work on your sugars and getting them under controlled. We can check it again in 3 months and if elevated we can add another medication.   I am ordering lab work and cxr for you. Also I am ordering stool test to screen for colon cancer.   I have ordered the following labs today:   Lab Orders         Fecal occult blood, imunochemical         Glucose, capillary         CMP14 + Anion Gap         Vitamin D  (25 hydroxy)         Lipid Profile         POC Hbg A1C       Referrals ordered today:   Referral Orders  No referral(s) requested today     I have ordered the following medication/changed the following medications:   Stop the following medications: Medications Discontinued During This Encounter  Medication Reason   gabapentin  (NEURONTIN ) 100 MG capsule    gabapentin  (NEURONTIN ) 100 MG capsule    metFORMIN  (GLUCOPHAGE ) 1000 MG tablet Duplicate   rosuvastatin  (CRESTOR ) 40 MG tablet Reorder   rosuvastatin  (CRESTOR ) 40 MG tablet Duplicate   Canagliflozin -metFORMIN  HCl (INVOKAMET ) 50-1000 MG TABS Duplicate     Start the following medications: Meds ordered this encounter  Medications   rosuvastatin  (CRESTOR ) 40 MG tablet    Sig: Take 1 tablet (40 mg total) by mouth daily.    Dispense:  90 tablet    Refill:  2    IM program     Follow-up: 3 month follow up   Please make sure to arrive 15 minutes prior to your next appointment. If you arrive late, you may be asked to reschedule.   We look forward to seeing you next time. Please call our clinic at [phone] if you have any questions or concerns. The best time to call is Monday-Friday from 9am-4pm, but there is someone available 24/7. If after hours or the weekend,  call the main hospital number and ask for the Internal Medicine Resident On-Call. If you need medication refills, please notify your pharmacy one week in advance and they will send us  a request.  Thank you for letting us  take part in your care. Wishing you the best!  Thank you, Jackolyn Masker, MD   Sra. Romualdo Cobble, fue un placer verla hoy! Dijo sentirse bien hoy. A continuacin, se detallan algunos de los temas que conversamos en esta visita. Esperamos verla en la cita de seguimiento!  Hoy hablamos sobre: Continuar tomando sus medicamentos.  Controlar sus niveles de azcar. Podemos volver a controlarlos en 3 meses y, si estn elevados, podemos aadir otro medicamento.  Le solicitar anlisis de laboratorio y [place] radiografa de trax. Tambin le solicitar un anlisis de heces para Engineer, site de colon.  [employer] he solicitado los siguientes anlisis:  rdenes de [employer] oculta en heces, inmunoqumica Glucosa capilar CMP14 + anin gap Vitamina D (25 hidroxi) Perfil lipdico Hbg A1C en el punto de atencin  Remisiones solicitadas hoy:  rdenes de referencia No se han solicitado referencias hoy  He solicitado/cambiado los siguientes medicamentos:  Suspender los siguientes medicamentos: Medicamentos suspendidos durante  esta consulta Motivo del medicamento  gabapentina (NEURONTIN ) cpsula de 100 mg  gabapentina (NEURONTIN ) cpsula de 100 mg  metformina (GLUCOPHAGE ) tableta de 1000 mg (duplicado)  rosuvastatina (CRESTOR ) tableta de 40 mg (reordenar)  rosuvastatina (CRESTOR ) tableta de 40 mg Duplicado  Canagliflozina-metFORMINA HCl (INVOKAMET ) 50-1000 MG COMPRIMIDOS  Inicie los siguientes medicamentos: Medicamentos indicados en esta consulta Medicamentos  Rosuvastatina (CRESTOR ) 40 MG comprimido Sig: Tomar 1 comprimido (40 mg en total) por va oral al da. Dispensacin: 90 comprimidos Resurtido: 2 Programa IM  Seguimiento: Seguimiento a los 3 meses  Asegrese  de llegar 15 minutos antes de su prxima cita. Si llega tarde, es posible que le pidamos que la reprograme.  Esperamos verlo en su prxima cita. Si tiene alguna pregunta o inquietud, llame a nuestra clnica al [phone]. El mejor horario para llamar es de lunes a viernes de 9:00 a. m. a 4:00 p. m., pero hay alguien disponible las 24 horas, los 7 [address]  [address] de la [place]. Si es fuera del horario de atencin o durante el fin de [place], llame al nmero principal del hospital y pregunte por el Residente de [facility] [place]. Si necesita resurtidos de medicamentos, por favor notifique a su farmacia con una semana de anticipacin y nos enviarn una solicitud.  Gracias por permitirnos participar en su cuidado. Le deseamos lo mejor!  Loise Rinks, Dr. Jackolyn Masker

## 2024-02-24 LAB — VITAMIN D 25 HYDROXY (VIT D DEFICIENCY, FRACTURES): Vit D, 25-Hydroxy: 33 ng/mL (ref 30.0–100.0)

## 2024-02-24 LAB — CMP14 + ANION GAP
ALT: 23 IU/L (ref 0–32)
AST: 29 IU/L (ref 0–40)
Albumin: 4.4 g/dL (ref 3.8–4.8)
Alkaline Phosphatase: 63 IU/L (ref 44–121)
Anion Gap: 16 mmol/L (ref 10.0–18.0)
BUN/Creatinine Ratio: 29 — ABNORMAL HIGH (ref 12–28)
BUN: 16 mg/dL (ref 8–27)
Bilirubin Total: 0.8 mg/dL (ref 0.0–1.2)
CO2: 23 mmol/L (ref 20–29)
Calcium: 9.5 mg/dL (ref 8.7–10.3)
Chloride: 100 mmol/L (ref 96–106)
Creatinine, Ser: 0.56 mg/dL — ABNORMAL LOW (ref 0.57–1.00)
Globulin, Total: 2.7 g/dL (ref 1.5–4.5)
Glucose: 132 mg/dL — ABNORMAL HIGH (ref 70–99)
Potassium: 4.8 mmol/L (ref 3.5–5.2)
Sodium: 139 mmol/L (ref 134–144)
Total Protein: 7.1 g/dL (ref 6.0–8.5)
eGFR: 98 mL/min/{1.73_m2} (ref >59)

## 2024-02-24 LAB — MICROALBUMIN / CREATININE URINE RATIO
Creatinine, Urine: 51.7 mg/dL
Microalb/Creat Ratio: 7 mg/g{creat} (ref 0–29)
Microalbumin, Urine: 3.8 ug/mL

## 2024-02-24 LAB — LIPID PANEL
Chol/HDL Ratio: 2.1 ratio (ref 0.0–4.4)
Cholesterol, Total: 123 mg/dL (ref 100–199)
HDL: 58 mg/dL (ref >39)
LDL Chol Calc (NIH): 46 mg/dL (ref 0–99)
Triglycerides: 106 mg/dL (ref 0–149)
VLDL Cholesterol Cal: 19 mg/dL (ref 5–40)

## 2024-02-25 NOTE — Assessment & Plan Note (Signed)
 Pt with HLD who is on crestor  40 mg every day. LDL checked this visit is well controlled at 46. ASCVD risk calculated at 17.4 %. Will continue current regimen.

## 2024-02-25 NOTE — Assessment & Plan Note (Signed)
 Pt with HTN that is controlled with lifestyle modifications. Renal fxn checked and normal. CTM.

## 2024-02-25 NOTE — Assessment & Plan Note (Signed)
 Overall pt is asymptomatic from her sarcoidosis. No joint pains, rashes or DOE. Given last imaging was done quite a while ago, will get CXR. Will also get LFTs to ensure no sign of hepatobiliary involvement. CXR was unchanged from prior. LFTs were unremarkable.

## 2024-02-25 NOTE — Assessment & Plan Note (Signed)
 Pt with DMII currently uncontrolled. She is on Invokamet  50-1000 mg BID. Last 7.7. A1c this visit increased to 8.4. Pt endorsed dietary indiscretions but has been adherent to her regimen. Will have pt try to limit carb intake and incorporate more walks into her day. If she remains uncontrolled on the follow up visit, then we can start another agent like glipizide . Pt understanding of this. Pt is uninsured and that is limiting factor for GLP1 agonist.

## 2024-02-25 NOTE — Assessment & Plan Note (Signed)
 Checked this visit and normal at 33.

## 2024-02-25 NOTE — Assessment & Plan Note (Signed)
 ACR checked this visit.  Pt had MM that she will bring in results for. This can be marked as completed at next visit.  FIT test kit given.

## 2024-02-29 ENCOUNTER — Ambulatory Visit: Payer: Self-pay | Admitting: Internal Medicine

## 2024-02-29 NOTE — Progress Notes (Signed)
 Internal Medicine Clinic Attending  Case discussed with the resident at the time of the visit.  We reviewed the resident's history and exam and pertinent patient test results.  I agree with the assessment, diagnosis, and plan of care documented in the resident's note.

## 2024-03-28 ENCOUNTER — Encounter: Payer: Self-pay | Admitting: *Deleted

## 2024-07-13 ENCOUNTER — Other Ambulatory Visit (HOSPITAL_COMMUNITY): Payer: Self-pay

## 2024-10-07 ENCOUNTER — Other Ambulatory Visit: Payer: Self-pay | Admitting: Student in an Organized Health Care Education/Training Program

## 2024-10-18 ENCOUNTER — Telehealth: Payer: Self-pay

## 2024-10-18 NOTE — Telephone Encounter (Signed)
 Renewal application mailed to patient 07/13/24.  Received signed application 10/19/23, however company auto renewed for 2026.   Invokamet  50-1000mg  - medication will ship to patients home.     Patient needs appointment.
